# Patient Record
Sex: Female | Born: 1960 | Race: Black or African American | Hispanic: No | Marital: Single | Smoking: Never smoker
Health system: Southern US, Community
[De-identification: ages and names within clinical notes are randomized; demographics above are authoritative.]

## PROBLEM LIST (undated history)

## (undated) DIAGNOSIS — J45909 Unspecified asthma, uncomplicated: Secondary | ICD-10-CM

## (undated) DIAGNOSIS — D219 Benign neoplasm of connective and other soft tissue, unspecified: Secondary | ICD-10-CM

## (undated) DIAGNOSIS — R739 Hyperglycemia, unspecified: Secondary | ICD-10-CM

## (undated) DIAGNOSIS — I10 Essential (primary) hypertension: Secondary | ICD-10-CM

## (undated) HISTORY — DX: Benign neoplasm of connective and other soft tissue, unspecified: D21.9

## (undated) HISTORY — DX: Hyperglycemia, unspecified: R73.9

## (undated) HISTORY — DX: Unspecified asthma, uncomplicated: J45.909

## (undated) HISTORY — DX: Essential (primary) hypertension: I10

---

## 1999-11-28 HISTORY — PX: MYOMECTOMY: SHX85

## 2009-03-18 ENCOUNTER — Ambulatory Visit: Payer: Self-pay | Admitting: Gynecology

## 2009-03-18 ENCOUNTER — Other Ambulatory Visit: Admission: RE | Admit: 2009-03-18 | Discharge: 2009-03-18 | Payer: Self-pay | Admitting: Gynecology

## 2009-03-18 ENCOUNTER — Encounter: Payer: Self-pay | Admitting: Gynecology

## 2010-04-01 ENCOUNTER — Ambulatory Visit: Payer: Self-pay | Admitting: Gynecology

## 2010-04-01 ENCOUNTER — Other Ambulatory Visit: Admission: RE | Admit: 2010-04-01 | Discharge: 2010-04-01 | Payer: Self-pay | Admitting: Gynecology

## 2010-04-26 ENCOUNTER — Encounter: Admission: RE | Admit: 2010-04-26 | Discharge: 2010-04-26 | Payer: Self-pay | Admitting: Gynecology

## 2011-04-10 ENCOUNTER — Other Ambulatory Visit: Payer: Self-pay | Admitting: Gynecology

## 2011-04-10 ENCOUNTER — Other Ambulatory Visit (HOSPITAL_COMMUNITY)
Admission: RE | Admit: 2011-04-10 | Discharge: 2011-04-10 | Disposition: A | Discharge status: Home / Self Care | Payer: BC Managed Care – PPO | Source: Ambulatory Visit | Attending: Gynecology | Admitting: Gynecology

## 2011-04-10 ENCOUNTER — Encounter (INDEPENDENT_AMBULATORY_CARE_PROVIDER_SITE_OTHER): Payer: BC Managed Care – PPO | Admitting: Gynecology

## 2011-04-10 DIAGNOSIS — R823 Hemoglobinuria: Secondary | ICD-10-CM

## 2011-04-10 DIAGNOSIS — Z124 Encounter for screening for malignant neoplasm of cervix: Secondary | ICD-10-CM | POA: Insufficient documentation

## 2011-04-10 DIAGNOSIS — Z01419 Encounter for gynecological examination (general) (routine) without abnormal findings: Secondary | ICD-10-CM

## 2011-04-10 DIAGNOSIS — Z13220 Encounter for screening for lipoid disorders: Secondary | ICD-10-CM

## 2011-04-10 DIAGNOSIS — Z833 Family history of diabetes mellitus: Secondary | ICD-10-CM

## 2011-04-26 ENCOUNTER — Ambulatory Visit (INDEPENDENT_AMBULATORY_CARE_PROVIDER_SITE_OTHER): Payer: BC Managed Care – PPO | Admitting: Gynecology

## 2011-04-26 ENCOUNTER — Other Ambulatory Visit: Payer: BC Managed Care – PPO

## 2011-04-26 DIAGNOSIS — D259 Leiomyoma of uterus, unspecified: Secondary | ICD-10-CM

## 2011-04-26 DIAGNOSIS — N852 Hypertrophy of uterus: Secondary | ICD-10-CM

## 2011-04-26 DIAGNOSIS — N920 Excessive and frequent menstruation with regular cycle: Secondary | ICD-10-CM

## 2011-04-26 DIAGNOSIS — D252 Subserosal leiomyoma of uterus: Secondary | ICD-10-CM

## 2011-04-26 DIAGNOSIS — D251 Intramural leiomyoma of uterus: Secondary | ICD-10-CM

## 2011-06-22 ENCOUNTER — Other Ambulatory Visit: Payer: Self-pay | Admitting: Gynecology

## 2011-06-22 DIAGNOSIS — Z1231 Encounter for screening mammogram for malignant neoplasm of breast: Secondary | ICD-10-CM

## 2011-07-18 ENCOUNTER — Ambulatory Visit
Admission: RE | Admit: 2011-07-18 | Discharge: 2011-07-18 | Disposition: A | Discharge status: Home / Self Care | Payer: BC Managed Care – PPO | Source: Ambulatory Visit | Attending: Gynecology | Admitting: Gynecology

## 2011-07-18 DIAGNOSIS — Z1231 Encounter for screening mammogram for malignant neoplasm of breast: Secondary | ICD-10-CM

## 2012-01-17 ENCOUNTER — Other Ambulatory Visit: Payer: Self-pay | Admitting: Physician Assistant

## 2012-04-18 ENCOUNTER — Other Ambulatory Visit: Payer: Self-pay | Admitting: *Deleted

## 2012-04-18 MED ORDER — NORETHIN ACE-ETH ESTRAD-FE 1-20 MG-MCG(24) PO TABS: 1.0000 | ORAL_TABLET | Freq: Every day | ORAL | Status: DC

## 2012-04-18 NOTE — Telephone Encounter (Signed)
Pt calling requesting refill on loestrin fe, annual appointment on may 30th. rx sent. No refills

## 2012-04-19 ENCOUNTER — Other Ambulatory Visit: Payer: Self-pay

## 2012-04-19 ENCOUNTER — Other Ambulatory Visit: Payer: Self-pay | Admitting: Emergency Medicine

## 2012-04-19 MED ORDER — FLUTICASONE PROPIONATE 50 MCG/ACT NA SUSP: 2.0000 | Freq: Every day | NASAL | Status: DC

## 2012-04-23 ENCOUNTER — Encounter: Payer: BC Managed Care – PPO | Admitting: Gynecology

## 2012-04-25 ENCOUNTER — Ambulatory Visit (INDEPENDENT_AMBULATORY_CARE_PROVIDER_SITE_OTHER): Payer: BC Managed Care – PPO | Admitting: Gynecology

## 2012-04-25 ENCOUNTER — Encounter: Payer: Self-pay | Admitting: Gynecology

## 2012-04-25 VITALS — BP 114/70 | Ht 63.5 in | Wt 124.0 lb

## 2012-04-25 DIAGNOSIS — Z01419 Encounter for gynecological examination (general) (routine) without abnormal findings: Secondary | ICD-10-CM

## 2012-04-25 DIAGNOSIS — D259 Leiomyoma of uterus, unspecified: Secondary | ICD-10-CM

## 2012-04-25 DIAGNOSIS — Z3009 Encounter for other general counseling and advice on contraception: Secondary | ICD-10-CM

## 2012-04-25 MED ORDER — NORETHIN ACE-ETH ESTRAD-FE 1-20 MG-MCG(24) PO TABS: 1.0000 | ORAL_TABLET | Freq: Every day | ORAL | Status: DC

## 2012-04-25 NOTE — Progress Notes (Signed)
Tiffany Cline 1961-05-28 [id number]        51 y.o.  for annual exam.  Several issues noted below.  Past medical history,surgical history, medications, allergies, family history and social history were all reviewed and documented in the EPIC chart. ROS:  Was performed and pertinent positives and negatives are included in the history.  Exam: Tiffany Cline present Filed Vitals:   04/25/12 1108  BP: 114/70   General appearance  Normal Skin grossly normal Head/Neck normal with no cervical or supraclavicular adenopathy thyroid normal Lungs  clear Cardiac RR, without RMG Abdominal  soft, nontender, without masses, organomegaly or hernia Breasts  examined lying and sitting without masses, retractions, discharge or axillary adenopathy.  Left nipple inverted which easily reverts. Pelvic  Ext/BUS/vagina  normal   Cervix  normal   Uterus  Retroverted to axial, bulky in size and shape, midline and mobile nontender   Adnexa  Without masses or tenderness    Anus and perineum  normal   Rectovaginal  normal sphincter tone without palpated masses or tenderness.    Assessment/Plan:  50 y.o. female for annual exam.    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 Patient is asymptomatic and we'll continue to monitor at her choice. 2. Contraceptive management. Patient is on low dose oral contraceptives. She is being treated for hypertension, well controlled. I again reviewed the risks of oral contraceptives with advancing age and underlying treated hypertension. The risks of stroke, heart attack, DVT reviewed. The issues of pregnancy and its risks as well as her history of migraines which have resolved on oral contraceptives were all reviewed. Patient wants to continue and she is comfortable and accepts the risks. I refilled her times a year. I did discuss that age 68-51 we may start checking a FSH during the pill free week. 3. Mammography. Patient is due for mammography in July I  reminded her to schedule this. Her left nipple is inverted which it has been and easily reverts on exam. SBE monthly reviewed. 4. Pap smear. Patient has no history of abnormal Pap smears before with several normal reports in her chart and last Pap smear May 2012. I reviewed current screening guidelines and a Pap smear was done today we'll plan on a less frequent screening interval. 5. Health maintenance. Patient is due to follow up with her internist in reference to her hypertension and she will continue to follow with them. She had a normal glucose, lipid profile and CBC last year and no blood work was ordered this year.  A urinalysis was done. Assuming she continues well from a gynecologic standpoint and she will see me in a year, sooner as needed.    [name] MD, 11:49 AM 04/25/2012

## 2012-04-25 NOTE — Patient Instructions (Addendum)
Continue oral contraceptives as we discussed. Schedule mammogram in July. Follow up in one year for annual gynecologic exam.

## 2012-04-26 LAB — URINALYSIS W MICROSCOPIC + REFLEX CULTURE
Bacteria, UA: NONE SEEN
Bilirubin Urine: NEGATIVE
Casts: NONE SEEN
Crystals: NONE SEEN
Glucose, UA: NEGATIVE mg/dL
Hgb urine dipstick: NEGATIVE
Ketones, ur: NEGATIVE mg/dL
Leukocytes, UA: NEGATIVE
Nitrite: NEGATIVE
Protein, ur: NEGATIVE mg/dL
Specific Gravity, Urine: 1.017 (ref 1.005–1.030)
Squamous Epithelial / LPF: NONE SEEN
Urobilinogen, UA: 0.2 mg/dL (ref 0.0–1.0)
pH: 5.5 (ref 5.0–8.0)

## 2012-05-02 ENCOUNTER — Encounter: Payer: Self-pay | Admitting: Gynecology

## 2012-05-12 ENCOUNTER — Ambulatory Visit (INDEPENDENT_AMBULATORY_CARE_PROVIDER_SITE_OTHER): Payer: BC Managed Care – PPO | Admitting: Emergency Medicine

## 2012-05-12 VITALS — BP 131/82 | HR 67 | Temp 98.6°F | Resp 16 | Ht 63.0 in | Wt 124.0 lb

## 2012-05-12 DIAGNOSIS — J309 Allergic rhinitis, unspecified: Secondary | ICD-10-CM

## 2012-05-12 DIAGNOSIS — J45909 Unspecified asthma, uncomplicated: Secondary | ICD-10-CM

## 2012-05-12 DIAGNOSIS — I10 Essential (primary) hypertension: Secondary | ICD-10-CM

## 2012-05-12 DIAGNOSIS — J302 Other seasonal allergic rhinitis: Secondary | ICD-10-CM

## 2012-05-12 MED ORDER — MONTELUKAST SODIUM 10 MG PO TABS: 10.0000 mg | ORAL_TABLET | Freq: Every day | ORAL | Status: DC

## 2012-05-12 MED ORDER — ALBUTEROL SULFATE HFA 108 (90 BASE) MCG/ACT IN AERS: 2.0000 | INHALATION_SPRAY | RESPIRATORY_TRACT | Status: DC | PRN

## 2012-05-12 MED ORDER — AMLODIPINE BESYLATE 5 MG PO TABS: 5.0000 mg | ORAL_TABLET | Freq: Every day | ORAL | Status: DC

## 2012-05-12 MED ORDER — FLUTICASONE PROPIONATE 50 MCG/ACT NA SUSP: 2.0000 | Freq: Every day | NASAL | Status: DC

## 2012-05-12 MED ORDER — FLUTICASONE-SALMETEROL 250-50 MCG/DOSE IN AEPB: 1.0000 | INHALATION_SPRAY | Freq: Two times a day (BID) | RESPIRATORY_TRACT | Status: DC

## 2012-05-12 NOTE — Progress Notes (Signed)
  Subjective:    Patient ID: Tiffany Cline, female    DOB: 1960/12/26, 51 y.o.   MRN: [id number]  HPI patient needs refill of all her allergy medicines. She has been doing relatively well except for recent headache. She recently finished a long trip to [place] and is now back at work.    Review of Systems     Objective:   Physical Exam HEENT exam is unremarkable neck supple chest clear.        Assessment & Plan:  Asthma stable at present time no changes in medication

## 2012-05-14 ENCOUNTER — Ambulatory Visit: Payer: Self-pay | Admitting: Emergency Medicine

## 2012-06-27 ENCOUNTER — Other Ambulatory Visit: Payer: Self-pay | Admitting: Emergency Medicine

## 2012-09-11 ENCOUNTER — Other Ambulatory Visit: Payer: Self-pay | Admitting: Gynecology

## 2012-09-11 DIAGNOSIS — Z1231 Encounter for screening mammogram for malignant neoplasm of breast: Secondary | ICD-10-CM

## 2012-09-12 ENCOUNTER — Ambulatory Visit (INDEPENDENT_AMBULATORY_CARE_PROVIDER_SITE_OTHER): Payer: BC Managed Care – PPO | Admitting: Radiology

## 2012-09-12 DIAGNOSIS — Z23 Encounter for immunization: Secondary | ICD-10-CM

## 2012-09-26 ENCOUNTER — Ambulatory Visit
Admission: RE | Admit: 2012-09-26 | Discharge: 2012-09-26 | Disposition: A | Discharge status: Home / Self Care | Payer: BC Managed Care – PPO | Source: Ambulatory Visit | Attending: Gynecology | Admitting: Gynecology

## 2012-09-26 DIAGNOSIS — Z1231 Encounter for screening mammogram for malignant neoplasm of breast: Secondary | ICD-10-CM

## 2012-12-03 ENCOUNTER — Ambulatory Visit: Payer: BC Managed Care – PPO | Admitting: Physician Assistant

## 2012-12-03 VITALS — BP 114/66 | HR 97 | Temp 98.8°F | Resp 18 | Ht 63.5 in | Wt 120.0 lb

## 2012-12-03 DIAGNOSIS — R509 Fever, unspecified: Secondary | ICD-10-CM

## 2012-12-03 DIAGNOSIS — R059 Cough, unspecified: Secondary | ICD-10-CM

## 2012-12-03 DIAGNOSIS — J101 Influenza due to other identified influenza virus with other respiratory manifestations: Secondary | ICD-10-CM

## 2012-12-03 DIAGNOSIS — J111 Influenza due to unidentified influenza virus with other respiratory manifestations: Secondary | ICD-10-CM

## 2012-12-03 DIAGNOSIS — R05 Cough: Secondary | ICD-10-CM

## 2012-12-03 LAB — POCT INFLUENZA A/B
Influenza A, POC: POSITIVE
Influenza B, POC: NEGATIVE

## 2012-12-03 MED ORDER — OSELTAMIVIR PHOSPHATE 75 MG PO CAPS: 75.0000 mg | ORAL_CAPSULE | Freq: Two times a day (BID) | ORAL | Status: DC

## 2012-12-03 MED ORDER — HYDROCOD POLST-CHLORPHEN POLST 10-8 MG/5ML PO LQCR: 5.0000 mL | Freq: Two times a day (BID) | ORAL | Status: DC | PRN

## 2012-12-03 NOTE — Progress Notes (Signed)
Patient ID: Tiffany Cline MRN: [id number], DOB: Aug 15, 1961, 52 y.o. Date of Encounter: 12/03/2012, 10:10 AM  Primary Physician: Lucilla Edin, MD  Chief Complaint: fever, chills, nasal congestion  HPI: 52 y.o. year old female with presents with 1 1/2 day history of nasal congestion, chills, and slight dry cough. Complains of chest pain while coughing. No SOB, nausea, vomiting, or dizziness. Did have a headache for the past 3 days that has improved slightly. Complains of slight scratchy sore throat.  No otalgia or sinus pain.  Has thin, watery rhinorrhea. Has not taken any OTC medications for this.   Patient is otherwise doing well without issues or complaints.  Past Medical History  Diagnosis Date  . Hypertension   . Leiomyoma   . Allergy      Home Meds: Prior to Admission medications   Medication Sig Start Date End Date Taking? Authorizing Provider  albuterol (PROVENTIL HFA;VENTOLIN HFA) 108 (90 BASE) MCG/ACT inhaler Inhale 2 puffs into the lungs every 4 (four) hours as needed for wheezing (cough, shortness of breath or wheezing.). 05/12/12 05/12/13 Yes Collene Gobble, MD  amLODipine (NORVASC) 5 MG tablet Take 1 tablet (5 mg total) by mouth daily. 05/12/12  Yes Collene Gobble, MD  ASTEPRO 0.15 % SOLN USE ONE TO TWO SPRAYS IN EACH NOSTRIL TWICE DAILY 06/27/12  Yes Marzella Schlein McClung, PA-C  Azelastine HCl (ASTEPRO NA) by Nasal route.     Yes Historical Provider, MD  fluticasone (FLONASE) 50 MCG/ACT nasal spray Place 2 sprays into the nose daily. 05/12/12  Yes Collene Gobble, MD  Fluticasone-Salmeterol (ADVAIR DISKUS) 250-50 MCG/DOSE AEPB Inhale 1 puff into the lungs 2 (two) times daily. 05/12/12  Yes Collene Gobble, MD  GuaiFENesin (MUCINEX PO) Take by mouth.     Yes Historical Provider, MD  Loratadine (CLARITIN PO) Take by mouth.     Yes Historical Provider, MD  montelukast (SINGULAIR) 10 MG tablet Take 1 tablet (10 mg total) by mouth at bedtime. 05/12/12  Yes Collene Gobble, MD  Montelukast Sodium  (SINGULAIR PO) Take by mouth.     Yes Historical Provider, MD  multivitamin [facility]) per tablet Take 1 tablet by mouth daily.     Yes Historical Provider, MD  Norethindrone Acetate-Ethinyl Estrad-FE (LOESTRIN 24 FE) 1-20 MG-MCG(24) tablet Take 1 tablet by mouth daily. 04/25/12  Yes [name], MD  chlorpheniramine-HYDROcodone (TUSSIONEX PENNKINETIC ER) 10-8 MG/5ML LQCR Take 5 mLs by mouth every 12 (twelve) hours as needed (cough). 12/03/12   Jaquavious Mercer Jaquita Rector, PA-C  oseltamivir (TAMIFLU) 75 MG capsule Take 1 capsule (75 mg total) by mouth 2 (two) times daily. 12/03/12   Nelva Nay, PA-C    Allergies:  Allergies  Allergen Reactions  . Septra (Sulfamethoxazole W-Trimethoprim) Shortness Of Breath and Rash  . Sulfa Antibiotics     History   Social History  . Marital Status: Single    Spouse Name: [name]    Number of Children: [name]  . Years of Education: [name]   Occupational History  . Not on file.   Social History Main Topics  . Smoking status: Never Smoker   . Smokeless tobacco: Never Used  . Alcohol Use: No  . Drug Use: No  . Sexually Active: Yes    Birth Control/ Protection: Pill   Other Topics Concern  . Not on file   Social History Narrative  . No narrative on file     Review of Systems: Constitutional: positive for chills and fever.  Negative for night sweats, weight changes, or fatigue  HEENT: negative for vision changes, hearing loss. Positive for congestion, rhinorrhea, ST. No epistaxis, or sinus pressure Cardiovascular: negative for chest pain or palpitations Respiratory: positive for cough. negative for hemoptysis, wheezing, shortness of breath. Abdominal: negative for abdominal pain, nausea, vomiting, diarrhea, or constipation Dermatological: negative for rashes. Neurologic: negative for headache, dizziness, or syncope   Physical Exam: Blood pressure 114/66, pulse 97, temperature 98.8 F (37.1 C), temperature source Oral, resp. rate 18, height 5' 3.5"  (1.613 m), weight 120 lb (54.432 kg), last menstrual period 11/29/2012, SpO2 98.00%., Body mass index is 20.92 kg/(m^2). General: Well developed, well nourished, in no acute distress. Head: Normocephalic, atraumatic, eyes without discharge, sclera non-icteric, nares are without discharge. Bilateral auditory canals clear, TM's are without perforation, pearly grey and translucent with reflective cone of light bilaterally. Oral cavity moist, posterior pharynx without exudate, erythema, peritonsillar abscess, or post nasal drip.  Neck: Supple. No thyromegaly. Full ROM. No lymphadenopathy. Lungs: Clear bilaterally to auscultation without wheezes, rales, or rhonchi. Breathing is unlabored. Heart: RRR with S1 S2. No murmurs, rubs, or gallops appreciated. Msk:  Strength and tone normal for age. Extremities/Skin: Warm and dry. No clubbing or cyanosis. No edema.  Neuro: Alert and oriented X 3. Moves all extremities spontaneously. Gait is normal. CNII-XII grossly in tact. Psych:  Responds to questions appropriately with a normal affect.   Labs: Results for orders placed in visit on 12/03/12  POCT INFLUENZA A/B      Component Value Range   Influenza A, POC Positive     Influenza B, POC Negative       ASSESSMENT AND PLAN:  52 y.o. year old female with influenza Increase fluids and rest Ibuprofen and tylenol in alternating doses q3hours Start Tamiflu 75 mg bid x 5 days Tussionex q12hours prn cough Follow up if symptoms worsen or fail to improve.  SignedRhoderick Moody, PA-C 12/03/2012 10:10 AM

## 2012-12-10 ENCOUNTER — Telehealth: Payer: Self-pay | Admitting: *Deleted

## 2012-12-10 MED ORDER — MOMETASONE FURO-FORMOTEROL FUM 200-5 MCG/ACT IN AERO: 2.0000 | INHALATION_SPRAY | Freq: Two times a day (BID) | RESPIRATORY_TRACT | Status: DC

## 2012-12-10 NOTE — Telephone Encounter (Signed)
[facility] fax that pt ins is not covering Advair for 2014.  They prefer Dulera or Symbicort. Please change rx to one of these if appropriate.

## 2013-03-30 ENCOUNTER — Other Ambulatory Visit: Payer: Self-pay | Admitting: Gynecology

## 2013-04-05 ENCOUNTER — Other Ambulatory Visit: Payer: Self-pay | Admitting: Physician Assistant

## 2013-04-29 ENCOUNTER — Ambulatory Visit (INDEPENDENT_AMBULATORY_CARE_PROVIDER_SITE_OTHER): Payer: BC Managed Care – PPO | Admitting: Gynecology

## 2013-04-29 ENCOUNTER — Encounter: Payer: Self-pay | Admitting: Gynecology

## 2013-04-29 VITALS — BP 124/80 | Ht 63.25 in | Wt 123.0 lb

## 2013-04-29 DIAGNOSIS — Z01419 Encounter for gynecological examination (general) (routine) without abnormal findings: Secondary | ICD-10-CM

## 2013-04-29 DIAGNOSIS — Z309 Encounter for contraceptive management, unspecified: Secondary | ICD-10-CM

## 2013-04-29 DIAGNOSIS — D259 Leiomyoma of uterus, unspecified: Secondary | ICD-10-CM

## 2013-04-29 MED ORDER — NORETHIN ACE-ETH ESTRAD-FE 1-20 MG-MCG(24) PO CHEW: 1.0000 | CHEWABLE_TABLET | Freq: Every day | ORAL | Status: DC

## 2013-04-29 NOTE — Patient Instructions (Signed)
Followup in one year for annual exam, sooner if any issues 

## 2013-04-29 NOTE — Progress Notes (Signed)
Tiffany Cline 22-Nov-1961 [id number]        52 y.o.  G0P0 for annual exam.  Several issues noted below.  Past medical history,surgical history, medications, allergies, family history and social history were all reviewed and documented in the EPIC chart.  ROS:  Performed and pertinent positives and negatives are included in the history, assessment and plan .  Exam: Tiffany Cline assistant Filed Vitals:   04/29/13 1210  BP: 124/80  Height: 5' 3.25" (1.607 m)  Weight: 123 lb (55.792 kg)   General appearance  Normal Skin grossly normal Head/Neck normal with no cervical or supraclavicular adenopathy thyroid normal Lungs  clear Cardiac RR, without RMG Abdominal  soft, nontender, without masses, organomegaly or hernia Breasts  examined lying and sitting without masses, retractions, discharge or axillary adenopathy. Left nipple dimpled as always and easily reverts. Pelvic  Ext/BUS/vagina  normal   Cervix  normal   Uterus  Bulky, midline and mobile nontender   Adnexa  Without masses or tenderness    Anus and perineum  normal   Rectovaginal  normal sphincter tone without palpated masses or tenderness.    Assessment/Plan:  52 y.o. G0P0 female for annual exam.   1. Contraceptive management. Patient continues on oral contraceptives doing well. I again reviewed the issues and risks to include possible increased risk of stroke heart attack DVT with advancing age as well as hypertension. She is well controlled understands the risks and wants to continue and I refilled her times a year. Alternatives to include IUD again discussed with her. Not having menopausal symptoms during the pill free week. We'll start to check [facility] next year. 2. Leiomyoma. Uterus bulky on exam and stable from prior exams. Known to have multiple small myomas on ultrasound. We'll continue with annual followup exams. Not having any heavy or atypical bleeding. 3. Mammography 08/2012. Continue with annual mammography. Left nipple dimpled as  always, easily reverts. SBE monthly reviewed. 4. Pap smear 2012. No Pap smear done today. No history of significant abnormal Pap smears. Plan repeat next year at 3 year interval. 5. Colonoscopy. Recommended screening colonoscopy as she turned 52. Patient agrees to arrange. 6. Health maintenance. Dr. Cleta Cline does all of her blood work and she has an appointment to see him over the next several weeks. Followup one year, sooner as needed.    Tiffany Lords MD, 12:37 PM 04/29/2013

## 2013-05-24 ENCOUNTER — Other Ambulatory Visit: Payer: Self-pay | Admitting: Emergency Medicine

## 2013-05-27 ENCOUNTER — Ambulatory Visit (INDEPENDENT_AMBULATORY_CARE_PROVIDER_SITE_OTHER): Payer: BC Managed Care – PPO | Admitting: Emergency Medicine

## 2013-05-27 ENCOUNTER — Encounter: Payer: Self-pay | Admitting: Emergency Medicine

## 2013-05-27 VITALS — BP 112/84 | HR 71 | Temp 99.9°F | Resp 16 | Ht 63.75 in | Wt 124.0 lb

## 2013-05-27 DIAGNOSIS — I10 Essential (primary) hypertension: Secondary | ICD-10-CM | POA: Insufficient documentation

## 2013-05-27 DIAGNOSIS — J309 Allergic rhinitis, unspecified: Secondary | ICD-10-CM

## 2013-05-27 DIAGNOSIS — Z23 Encounter for immunization: Secondary | ICD-10-CM

## 2013-05-27 LAB — CBC WITH DIFFERENTIAL/PLATELET
Basophils Absolute: 0 10*3/uL (ref 0.0–0.1)
Basophils Relative: 0 % (ref 0–1)
Eosinophils Absolute: 0.1 10*3/uL (ref 0.0–0.7)
Eosinophils Relative: 1 % (ref 0–5)
HCT: 39.5 % (ref 36.0–46.0)
Hemoglobin: 13.1 g/dL (ref 12.0–15.0)
Lymphocytes Relative: 34 % (ref 12–46)
Lymphs Abs: 1.7 10*3/uL (ref 0.7–4.0)
MCH: 28.1 pg (ref 26.0–34.0)
MCHC: 33.2 g/dL (ref 30.0–36.0)
MCV: 84.6 fL (ref 78.0–100.0)
Monocytes Absolute: 0.6 10*3/uL (ref 0.1–1.0)
Monocytes Relative: 13 % — ABNORMAL HIGH (ref 3–12)
Neutro Abs: 2.5 10*3/uL (ref 1.7–7.7)
Neutrophils Relative %: 52 % (ref 43–77)
Platelets: 250 10*3/uL (ref 150–400)
RBC: 4.67 MIL/uL (ref 3.87–5.11)
RDW: 14.4 % (ref 11.5–15.5)
WBC: 4.9 10*3/uL (ref 4.0–10.5)

## 2013-05-27 MED ORDER — AMLODIPINE BESYLATE 5 MG PO TABS: 5.0000 mg | ORAL_TABLET | Freq: Every day | ORAL | Status: DC

## 2013-05-27 NOTE — Progress Notes (Signed)
  Subjective:    Patient ID: Tiffany Cline, female    DOB: [birth year], 52 y.o.   MRN: [id number]  HPI patient in for followup of her high blood pressure. She is currently on birth control pills she is on Norvasc daily for blood pressure control. She has a history of allergies and has used to air and Singulair and Flonase in the past but during the summer she rarely has problems. She has no complaints of chest pain shortness of breath GI regularities or other problems. 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 Neck supple chest clear heart regular rate no murmurs abdomen soft nontender extremities without edema.       Assessment & Plan:  Routine labs were done including cholesterol. She was given her first hep B vaccine to complete the series. Her Norvasc was refilled for one year she was also given a prescription for shingles vaccine. She agrees to call me when she is ready to get her shingles vaccine.

## 2013-05-28 LAB — COMPREHENSIVE METABOLIC PANEL
ALT: 17 U/L (ref 0–35)
AST: 16 U/L (ref 0–37)
Albumin: 3.6 g/dL (ref 3.5–5.2)
Alkaline Phosphatase: 39 U/L (ref 39–117)
BUN: 11 mg/dL (ref 6–23)
CO2: 26 mEq/L (ref 19–32)
Calcium: 8.8 mg/dL (ref 8.4–10.5)
Chloride: 106 mEq/L (ref 96–112)
Creat: 0.71 mg/dL (ref 0.50–1.10)
Glucose, Bld: 66 mg/dL — ABNORMAL LOW (ref 70–99)
Potassium: 4.1 mEq/L (ref 3.5–5.3)
Sodium: 139 mEq/L (ref 135–145)
Total Bilirubin: 0.3 mg/dL (ref 0.3–1.2)
Total Protein: 6.7 g/dL (ref 6.0–8.3)

## 2013-05-28 LAB — LIPID PANEL
Cholesterol: 175 mg/dL (ref 0–200)
HDL: 69 mg/dL (ref >39)
LDL Cholesterol: 97 mg/dL (ref 0–99)
Total CHOL/HDL Ratio: 2.5 Ratio
Triglycerides: 43 mg/dL (ref <150)
VLDL: 9 mg/dL (ref 0–40)

## 2013-05-29 ENCOUNTER — Other Ambulatory Visit: Payer: Self-pay | Admitting: Physician Assistant

## 2013-06-25 ENCOUNTER — Other Ambulatory Visit: Payer: Self-pay | Admitting: Emergency Medicine

## 2013-06-27 ENCOUNTER — Ambulatory Visit (INDEPENDENT_AMBULATORY_CARE_PROVIDER_SITE_OTHER): Payer: BC Managed Care – PPO

## 2013-06-27 DIAGNOSIS — Z23 Encounter for immunization: Secondary | ICD-10-CM

## 2013-06-27 NOTE — Progress Notes (Unsigned)
  Subjective:    Patient ID: Tiffany Cline, female    DOB: 1961/07/07, 52 y.o.   MRN: [id number]  HPI  Patient here for second Hep B injection only.  Review of Systems     Objective:   Physical Exam        Assessment & Plan:

## 2013-08-26 ENCOUNTER — Ambulatory Visit (INDEPENDENT_AMBULATORY_CARE_PROVIDER_SITE_OTHER): Payer: BC Managed Care – PPO | Admitting: *Deleted

## 2013-08-26 DIAGNOSIS — Z23 Encounter for immunization: Secondary | ICD-10-CM

## 2013-08-28 ENCOUNTER — Other Ambulatory Visit: Payer: Self-pay | Admitting: Emergency Medicine

## 2013-09-03 ENCOUNTER — Other Ambulatory Visit: Payer: Self-pay

## 2013-09-03 DIAGNOSIS — Z1231 Encounter for screening mammogram for malignant neoplasm of breast: Secondary | ICD-10-CM

## 2013-09-29 ENCOUNTER — Ambulatory Visit
Admission: RE | Admit: 2013-09-29 | Discharge: 2013-09-29 | Disposition: A | Discharge status: Home / Self Care | Payer: BC Managed Care – PPO | Source: Ambulatory Visit

## 2013-09-29 DIAGNOSIS — Z1231 Encounter for screening mammogram for malignant neoplasm of breast: Secondary | ICD-10-CM

## 2013-11-28 ENCOUNTER — Other Ambulatory Visit: Payer: Self-pay | Admitting: Emergency Medicine

## 2013-12-23 ENCOUNTER — Ambulatory Visit: Payer: BC Managed Care – PPO | Admitting: Physician Assistant

## 2013-12-23 ENCOUNTER — Ambulatory Visit: Payer: BC Managed Care – PPO

## 2013-12-23 ENCOUNTER — Encounter: Payer: Self-pay | Admitting: Physician Assistant

## 2013-12-23 VITALS — BP 110/62 | HR 82 | Temp 99.1°F | Resp 16 | Ht 63.75 in | Wt 121.2 lb

## 2013-12-23 DIAGNOSIS — J069 Acute upper respiratory infection, unspecified: Secondary | ICD-10-CM

## 2013-12-23 DIAGNOSIS — R059 Cough, unspecified: Secondary | ICD-10-CM

## 2013-12-23 DIAGNOSIS — J45901 Unspecified asthma with (acute) exacerbation: Secondary | ICD-10-CM

## 2013-12-23 DIAGNOSIS — R05 Cough: Secondary | ICD-10-CM

## 2013-12-23 LAB — POCT INFLUENZA A/B
Influenza A, POC: NEGATIVE
Influenza B, POC: NEGATIVE

## 2013-12-23 MED ORDER — HYDROCOD POLST-CHLORPHEN POLST 10-8 MG/5ML PO LQCR: 5.0000 mL | Freq: Two times a day (BID) | ORAL | Status: DC | PRN

## 2013-12-23 MED ORDER — PREDNISONE 20 MG PO TABS: ORAL_TABLET | ORAL | Status: DC

## 2013-12-23 MED ORDER — AZITHROMYCIN 250 MG PO TABS: ORAL_TABLET | ORAL | Status: DC

## 2013-12-23 MED ORDER — ALBUTEROL SULFATE (2.5 MG/3ML) 0.083% IN NEBU: 2.5000 mg | INHALATION_SOLUTION | Freq: Once | RESPIRATORY_TRACT | Status: DC

## 2013-12-23 MED ORDER — IPRATROPIUM BROMIDE 0.02 % IN SOLN: 0.5000 mg | Freq: Once | RESPIRATORY_TRACT | Status: AC

## 2013-12-23 NOTE — Patient Instructions (Signed)
Asthma, Adult Asthma is a recurring condition in which the airways tighten and narrow. Asthma can make it difficult to breathe. It can cause coughing, wheezing, and shortness of breath. Asthma episodes (also called asthma attacks) range from minor to life-threatening. Asthma cannot be cured, but medicines and lifestyle changes can help control it. CAUSES Asthma is believed to be caused by inherited (genetic) and environmental factors, but its exact cause is unknown. Asthma may be triggered by allergens, lung infections, or irritants in the air. Asthma triggers are different for each person. Common triggers include:   Animal dander.  Dust mites.  Cockroaches.  Pollen from trees or grass.  Mold.  Smoke.  Air pollutants such as dust, household cleaners, hair sprays, aerosol sprays, paint fumes, strong chemicals, or strong odors.  Cold air, weather changes, and winds (which increase molds and pollens in the air).  Strong emotional expressions such as crying or laughing hard.  Stress.  Certain medicines (such as aspirin) or types of drugs (such as beta-blockers).  Sulfites in foods and drinks. Foods and drinks that may contain sulfites include dried fruit, potato chips, and sparkling grape juice.  Infections or inflammatory conditions such as the flu, a cold, or an inflammation of the nasal membranes (rhinitis).  Gastroesophageal reflux disease (GERD).  Exercise or strenuous activity. SYMPTOMS Symptoms may occur immediately after asthma is triggered or many hours later. Symptoms include:  Wheezing.  Excessive nighttime or early morning coughing.  Frequent or severe coughing with a common cold.  Chest tightness.  Shortness of breath. DIAGNOSIS  The diagnosis of asthma is made by a review of your medical history and a physical exam. Tests may also be performed. These may include:  Lung function studies. These tests show how much air you breath in and out.  Allergy  tests.  Imaging tests such as X-rays. TREATMENT  Asthma cannot be cured, but it can usually be controlled. Treatment involves identifying and avoiding your asthma triggers. It also involves medicines. There are 2 classes of medicine used for asthma treatment:   Controller medicines. These prevent asthma symptoms from occurring. They are usually taken every day.  Reliever or rescue medicines. These quickly relieve asthma symptoms. They are used as needed and provide short-term relief. Your health care provider will help you create an asthma action plan. An asthma action plan is a written plan for managing and treating your asthma attacks. It includes a list of your asthma triggers and how they may be avoided. It also includes information on when medicines should be taken and when their dosage should be changed. An action plan may also involve the use of a device called a peak flow meter. A peak flow meter measures how well the lungs are working. It helps you monitor your condition. HOME CARE INSTRUCTIONS   Take medicine as directed by your health care provider. Speak with your health care provider if you have questions about how or when to take the medicines.  Use a peak flow meter as directed by your health care provider. Record and keep track of readings.  Understand and use the action plan to help minimize or stop an asthma attack without needing to seek medical care.  Control your home environment in the following ways to help prevent asthma attacks:  Do not smoke. Avoid being exposed to secondhand smoke.  Change your heating and air conditioning filter regularly.  Limit your use of fireplaces and wood stoves.  Get rid of pests (such as roaches and   mice) and their droppings.  Throw away plants if you see mold on them.  Clean your floors and dust regularly. Use unscented cleaning products.  Try to have someone else vacuum for you regularly. Stay out of rooms while they are being  vacuumed and for a short while afterward. If you vacuum, use a dust mask from a hardware store, a double-layered or microfilter vacuum cleaner bag, or a vacuum cleaner with a HEPA filter.  Replace carpet with wood, tile, or vinyl flooring. Carpet can trap dander and dust.  Use allergy-proof pillows, mattress covers, and box spring covers.  Wash bed sheets and blankets every week in hot water and dry them in a dryer.  Use blankets that are made of polyester or cotton.  Clean bathrooms and kitchens with bleach. If possible, have someone repaint the walls in these rooms with mold-resistant paint. Keep out of the rooms that are being cleaned and painted.  Wash hands frequently. SEEK MEDICAL CARE IF:   You have wheezing, shortness of breath, or a cough even if taking medicine to prevent attacks.  The colored mucus you cough up (sputum) is thicker than usual.  Your sputum changes from clear or white to yellow, green, gray, or bloody.  You have any problems that may be related to the medicines you are taking (such as a rash, itching, swelling, or trouble breathing).  You are using a reliever medicine more than 2 3 times per week.  Your peak flow is still at 50 79% of you personal best after following your action plan for 1 hour. SEEK IMMEDIATE MEDICAL CARE IF:   You seem to be getting worse and are unresponsive to treatment during an asthma attack.  You are short of breath even at rest.  You get short of breath when doing very little physical activity.  You have difficulty eating, drinking, or talking due to asthma symptoms.  You develop chest pain.  You develop a fast heartbeat.  You have a bluish color to your lips or fingernails.  You are lightheaded, dizzy, or faint.  Your peak flow is less than 50% of your personal best.  You have a fever or persistent symptoms for more than 2 3 days.  You have a fever and symptoms suddenly get worse. MAKE SURE YOU:   Understand these  instructions.  Will watch your condition.  Will get help right away if you are not doing well or get worse. Document Released: 11/13/2005 Document Revised: 07/16/2013 Document Reviewed: 06/12/2013 [employer] Patient Information 2014 [employer].  

## 2013-12-23 NOTE — Progress Notes (Signed)
Subjective:    Patient ID: Tiffany Cline, female    DOB: Aug 14, 1961, 53 y.o.   MRN: [id number]  HPI 53 year old female presents with a 3 day history of nasal congestion, chest congestion, post nasal drip, and cough. Mild sinus pressure. Subjective fever and chills. Nasal congestion thick and green/yellow. Cough is productive of green/yellow sputum and not associated with time of day. Ears feel full, leading to sensation of muffled hearing. Has been taking her Dulera, albuterol, and Mucinex 1,200 mg without any change in her chest congestion. No GI complaints. Appetite normal. Multiple sick contacts. 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 Influenza vaccine this year. States she has chest congestion at baseline but is able to clear this usually with 600 mg of Mucinex. She has not been able to do this with this URI.   No recent antibiotics, or recent travels.   No leg trauma, sedentary periods, h/o cancer, or tobacco use.  Her asthma is well controlled at baseline. Typically no flares unless she develops an upper respiratory infection. Last asthma exacerbation was January 2014 when she had influenza.   PMH: Past Medical History  Diagnosis Date  . Hypertension   . Leiomyoma   . Allergy   . Asthma     induced by cold and allergies    Home Meds: Prior to Admission medications   Medication Sig Start Date End Date Taking? Authorizing Provider  amLODipine (NORVASC) 5 MG tablet Take 1 tablet (5 mg total) by mouth daily. 05/27/13  Yes [name], MD  ASTEPRO 0.15 % SOLN USE ONE TO TWO SPRAYS IN EACH NOSTRIL TWICE DAILY 06/27/12  Yes Argentina Donovan, PA-C  DULERA 200-5 MCG/ACT AERO INHALE TWO PUFFS INTO THE LUNGS ONCE DAILY 05/29/13  Yes [name], MD  fluticasone (FLONASE) 50 MCG/ACT nasal spray USE TWO SPRAYS IN EACH NOSTRIL DAILY 11/28/13  Yes [name], MD  GuaiFENesin (MUCINEX PO) Take by mouth.     Yes Historical Provider, MD  montelukast (SINGULAIR) 10 MG tablet TAKE ONE TABLET BY  MOUTH EVERY DAY AT BEDTIME 06/25/13  Yes [name], MD  Norethin Ace-Eth Estrad-FE (MINASTRIN 24 FE) 1-20 MG-MCG(24) CHEW Chew 1 tablet by mouth daily. 04/29/13  Yes Anastasio Auerbach, MD  PROAIR HFA 108 (90 BASE) MCG/ACT inhaler INHALE TWO PUFFS BY MOUTH EVERY 4 HOURS AS NEEDED FOR WHEEZING, COUGH OR SHORTNESS OF BREATH 08/28/13  Yes [name], MD  multivitamin [facility]) per tablet Take 1 tablet by mouth daily.      Historical Provider, MD    Allergies:  Allergies  Allergen Reactions  . Septra [Sulfamethoxazole-Trimethoprim] Shortness Of Breath and Rash  . Sulfa Antibiotics     History   Social History  . Marital Status: Single    Spouse Name: [name]    Number of Children: [name]  . Years of Education: [name]   Occupational History  . Not on file.   Social History Main Topics  . Smoking status: Never Smoker   . Smokeless tobacco: Never Used  . Alcohol Use: No  . Drug Use: No  . Sexual Activity: Yes    Birth Control/ Protection: Pill   Other Topics Concern  . Not on file   Social History Narrative  . No narrative on file       Review of Systems  Constitutional: Positive for fever, chills and fatigue.       Has been taking 800 mg ibuprofen daily for uterine  fibroids.   HENT: Positive for congestion, postnasal drip, sinus pressure and sneezing. Negative for ear pain and sore throat.        Head and chest congestion.   Respiratory: Positive for cough. Negative for shortness of breath and wheezing.        Cough sounds productive, but cannot get any sputum out. Cough is worse first thing in the morning.   Gastrointestinal: Negative for nausea, vomiting and diarrhea.  Musculoskeletal: Negative for myalgias.  Neurological: Positive for light-headedness.       Headache is located along the frontal sinus.  Lightheadness is associated with sneezing and lasts 15-30 seconds.        Objective:   Physical Exam  Physical Exam: Blood pressure 110/62, pulse 82, temperature  99.1 F (37.3 C), temperature source Oral, resp. rate 16, height 5' 3.75" (1.619 m), weight 121 lb 3.2 oz (54.976 kg), last menstrual period 11/28/2013, SpO2 100.00%., Body mass index is 20.97 kg/(m^2). General: Well developed, well nourished, in no acute distress. Head: Normocephalic, atraumatic, eyes without discharge, sclera non-icteric, nares are congested. Bilateral auditory canals clear, TM's are without perforation, pearly grey with reflective cone of light bilaterally. No sinus TTP. Oral cavity moist, dentition normal. Posterior pharynx with post nasal drip and mild erythema. No peritonsillar abscess or tonsillar exudate. Uvula midline.  Neck: Supple. No thyromegaly. Full ROM. No lymphadenopathy. Lungs: Coarse breath sounds bilaterally with mild wheezing bilaterally. No rales or rhonchi. Breathing is unlabored. Status post albuterol and Atrovent neb: Patient does feel better. Breath sounds are improved.  Heart: RRR with S1 S2. No murmurs, rubs, or gallops appreciated. Msk:  Strength and tone normal for age. Extremities: No clubbing or cyanosis. No edema. Neuro: Alert and oriented X 3. Moves all extremities spontaneously. CNII-XII grossly in tact. Psych:  Responds to questions appropriately with a normal affect.   Labs:  Results for orders placed in visit on 12/23/13  POCT INFLUENZA A/B      Result Value Range   Influenza A, POC Negative     Influenza B, POC Negative     CXR:  [facility] reading (PRIMARY) by  Dr. Joseph Art. Hyperinflation, otherwise negative.      Assessment & Plan:  53 year old female with acute asthma exacerbation and cough secondary to URI.  -Azithromycin 250 MG #6 2 po first day then 1 po next 4 days no RF -Hycodan #4oz 1 tsp po q 4-6 hours prn cough no RF SED -Prednisone 20 mg #12 3x2, 2x2, 1x2 no RF -Continue current asthma medications -Rest/fluids -RTC precautions    Christell Faith, MHS, PA-C [facility] [place], [place]  [id number] [address] 12/23/2013 4:56 PM

## 2014-01-12 ENCOUNTER — Other Ambulatory Visit: Payer: Self-pay | Admitting: Emergency Medicine

## 2014-01-21 ENCOUNTER — Other Ambulatory Visit: Payer: Self-pay | Admitting: Physician Assistant

## 2014-02-17 ENCOUNTER — Ambulatory Visit (INDEPENDENT_AMBULATORY_CARE_PROVIDER_SITE_OTHER): Payer: BC Managed Care – PPO | Admitting: Physician Assistant

## 2014-02-17 DIAGNOSIS — Z23 Encounter for immunization: Secondary | ICD-10-CM

## 2014-02-17 NOTE — Progress Notes (Signed)
Pt here for her #3 Hep B.

## 2014-03-05 ENCOUNTER — Other Ambulatory Visit: Payer: Self-pay | Admitting: Gynecology

## 2014-05-02 ENCOUNTER — Other Ambulatory Visit: Payer: Self-pay | Admitting: Emergency Medicine

## 2014-05-08 ENCOUNTER — Encounter: Payer: Self-pay | Admitting: Gynecology

## 2014-05-08 ENCOUNTER — Other Ambulatory Visit (HOSPITAL_COMMUNITY)
Admission: RE | Admit: 2014-05-08 | Discharge: 2014-05-08 | Disposition: A | Discharge status: Home / Self Care | Payer: BC Managed Care – PPO | Source: Ambulatory Visit | Attending: Gynecology | Admitting: Gynecology

## 2014-05-08 ENCOUNTER — Ambulatory Visit (INDEPENDENT_AMBULATORY_CARE_PROVIDER_SITE_OTHER): Payer: BC Managed Care – PPO | Admitting: Gynecology

## 2014-05-08 VITALS — BP 114/74 | Ht 63.0 in | Wt 126.0 lb

## 2014-05-08 DIAGNOSIS — Z01419 Encounter for gynecological examination (general) (routine) without abnormal findings: Secondary | ICD-10-CM

## 2014-05-08 DIAGNOSIS — Z309 Encounter for contraceptive management, unspecified: Secondary | ICD-10-CM

## 2014-05-08 DIAGNOSIS — Z124 Encounter for screening for malignant neoplasm of cervix: Secondary | ICD-10-CM | POA: Insufficient documentation

## 2014-05-08 DIAGNOSIS — D251 Intramural leiomyoma of uterus: Secondary | ICD-10-CM

## 2014-05-08 DIAGNOSIS — Z1151 Encounter for screening for human papillomavirus (HPV): Secondary | ICD-10-CM | POA: Insufficient documentation

## 2014-05-08 MED ORDER — NORETHIN ACE-ETH ESTRAD-FE 1-20 MG-MCG(24) PO CHEW: CHEWABLE_TABLET | ORAL | Status: DC

## 2014-05-08 NOTE — Progress Notes (Signed)
Tiffany Cline [birth year] [id number]        53 y.o.  G0P0 for annual exam.  Several issues noted below.  Past medical history,surgical history, problem list, medications, allergies, family history and social history were all reviewed and documented as reviewed in the EPIC chart.  ROS:  12 system ROS performed with pertinent positives and negatives included in the history, assessment and plan.  Included Systems: General, HEENT, Neck, Cardiovascular, Pulmonary, Gastrointestinal, Genitourinary, Musculoskeletal, Dermatologic, Endocrine, Hematological, Neurologic, Psychiatric Additional significant findings :  None   Exam: Kim assistant Filed Vitals:   05/08/14 1554  BP: 114/74  Height: 5\' 3"  (1.6 m)  Weight: 126 lb (57.153 kg)   General appearance:  Normal affect, orientation and appearance. Skin: Grossly normal HEENT: Without gross lesions.  No cervical or supraclavicular adenopathy. Thyroid normal.  Lungs:  Clear without wheezing, rales or rhonchi Cardiac: RR, without RMG Abdominal:  Soft, nontender, without masses, guarding, rebound, organomegaly or hernia Breasts:  Examined lying and sitting without masses, retractions, discharge or axillary adenopathy. Pelvic:  Ext/BUS/vagina normal  Cervix normal. Pap/HPV  Uterus bulky midline mobile nontender consistent with leiomyoma  Adnexa  Without masses or tenderness    Anus and perineum  Normal   Rectovaginal  Normal sphincter tone without palpated masses or tenderness.    Assessment/Plan:  53 y.o. G0P0 female for annual exam with regular menses, oral contraceptives.   1. Contraceptive management. Patient continues on her Loestrin 120 equivalents BCP. Doing well without complaints. We have discussed on multiple occasions the risks to include stroke heart attack DVT. No menopausal symptoms at the end of her pill free week. Patient's comfortable with continuing. I did ask her return at the end of her pill free week for an [facility]. If climbing then  will consider stopping the pill. If normal will continue for another year. Future order placed and she'll come by to do this. 2. Leiomyoma. Patient's exam remains stable with a bulky uterus. She is asymptomatic without pressure bleeding or other symptoms. She is comfortable with expectant management. 3. Pap smear 2012. Pap/HPV today. No history of abnormal Pap smears previously. Plan repeat Pap smear at 3-5 year interval assuming this Pap smear is normal per current screening guidelines. 4. Colonoscopy never. Patient knows she needs to schedule and is planning to arrange this year. 5. Health maintenance. No routine blood work done as this is all done through Dr. Caren Griffins office. Followup for [facility] otherwise annually.   Note: This document was prepared with digital dictation and possible smart phrase technology. Any transcriptional errors that result from this process are unintentional.   Anastasio Auerbach MD, 4:21 PM 05/08/2014

## 2014-05-08 NOTE — Patient Instructions (Signed)
Followup at the end of your pill free week to have your blood drawn to see about menopause. Followup in one year for annual exam. Arrange for your colonoscopy.  You may obtain a copy of any labs that were done today by logging onto MyChart as outlined in the instructions provided with your AVS (after visit summary). The office will not call with normal lab results but certainly if there are any significant abnormalities then we will contact you.   Health Maintenance, Female A healthy lifestyle and preventative care can promote health and wellness.  Maintain regular health, dental, and eye exams.  Eat a healthy diet. Foods like vegetables, fruits, whole grains, low-fat dairy products, and lean protein foods contain the nutrients you need without too many calories. Decrease your intake of foods high in solid fats, added sugars, and salt. Get information about a proper diet from your caregiver, if necessary.  Regular physical exercise is one of the most important things you can do for your health. Most adults should get at least 150 minutes of moderate-intensity exercise (any activity that increases your heart rate and causes you to sweat) each week. In addition, most adults need muscle-strengthening exercises on 2 or more days a week.   Maintain a healthy weight. The body mass index (BMI) is a screening tool to identify possible weight problems. It provides an estimate of body fat based on height and weight. Your caregiver can help determine your BMI, and can help you achieve or maintain a healthy weight. For adults 20 years and older:  A BMI below 18.5 is considered underweight.  A BMI of 18.5 to 24.9 is normal.  A BMI of 25 to 29.9 is considered overweight.  A BMI of 30 and above is considered obese.  Maintain normal blood lipids and cholesterol by exercising and minimizing your intake of saturated fat. Eat a balanced diet with plenty of fruits and vegetables. Blood tests for lipids and  cholesterol should begin at age 56 and be repeated every 5 years. If your lipid or cholesterol levels are high, you are over 50, or you are a high risk for heart disease, you may need your cholesterol levels checked more frequently.Ongoing high lipid and cholesterol levels should be treated with medicines if diet and exercise are not effective.  If you smoke, find out from your caregiver how to quit. If you do not use tobacco, do not start.  Lung cancer screening is recommended for adults aged 38 80 years who are at high risk for developing lung cancer because of a history of smoking. Yearly low-dose computed tomography (CT) is recommended for people who have at least a 30-pack-year history of smoking and are a current smoker or have quit within the past 15 years. A pack year of smoking is smoking an average of 1 pack of cigarettes a day for 1 year (for example: 1 pack a day for 30 years or 2 packs a day for 15 years). Yearly screening should continue until the smoker has stopped smoking for at least 15 years. Yearly screening should also be stopped for people who develop a health problem that would prevent them from having lung cancer treatment.  If you are pregnant, do not drink alcohol. If you are breastfeeding, be very cautious about drinking alcohol. If you are not pregnant and choose to drink alcohol, do not exceed 1 drink per day. One drink is considered to be 12 ounces (355 mL) of beer, 5 ounces (148 mL) of wine,  or 1.5 ounces (44 mL) of liquor.  Avoid use of street drugs. Do not share needles with anyone. Ask for help if you need support or instructions about stopping the use of drugs.  High blood pressure causes heart disease and increases the risk of stroke. Blood pressure should be checked at least every 1 to 2 years. Ongoing high blood pressure should be treated with medicines, if weight loss and exercise are not effective.  If you are 4 to 51 years old, ask your caregiver if you should  take aspirin to prevent strokes.  Diabetes screening involves taking a blood sample to check your fasting blood sugar level. This should be done once every 3 years, after age 9, if you are within normal weight and without risk factors for diabetes. Testing should be considered at a younger age or be carried out more frequently if you are overweight and have at least 1 risk factor for diabetes.  Breast cancer screening is essential preventative care for women. You should practice "breast self-awareness." This means understanding the normal appearance and feel of your breasts and may include breast self-examination. Any changes detected, no matter how small, should be reported to a caregiver. Women in their 60s and 30s should have a clinical breast exam (CBE) by a caregiver as part of a regular health exam every 1 to 3 years. After age 28, women should have a CBE every year. Starting at age 76, women should consider having a mammogram (breast X-ray) every year. Women who have a family history of breast cancer should talk to their caregiver about genetic screening. Women at a high risk of breast cancer should talk to their caregiver about having an MRI and a mammogram every year.  Breast cancer gene (BRCA)-related cancer risk assessment is recommended for women who have family members with BRCA-related cancers. BRCA-related cancers include breast, ovarian, tubal, and peritoneal cancers. Having family members with these cancers may be associated with an increased risk for harmful changes (mutations) in the breast cancer genes BRCA1 and BRCA2. Results of the assessment will determine the need for genetic counseling and BRCA1 and BRCA2 testing.  The Pap test is a screening test for cervical cancer. Women should have a Pap test starting at age 10. Between ages 44 and 65, Pap tests should be repeated every 2 years. Beginning at age 27, you should have a Pap test every 3 years as long as the past 3 Pap tests have  been normal. If you had a hysterectomy for a problem that was not cancer or a condition that could lead to cancer, then you no longer need Pap tests. If you are between ages 40 and 28, and you have had normal Pap tests going back 10 years, you no longer need Pap tests. If you have had past treatment for cervical cancer or a condition that could lead to cancer, you need Pap tests and screening for cancer for at least 20 years after your treatment. If Pap tests have been discontinued, risk factors (such as a new sexual partner) need to be reassessed to determine if screening should be resumed. Some women have medical problems that increase the chance of getting cervical cancer. In these cases, your caregiver may recommend more frequent screening and Pap tests.  The human papillomavirus (HPV) test is an additional test that may be used for cervical cancer screening. The HPV test looks for the virus that can cause the cell changes on the cervix. The cells collected during the  Pap test can be tested for HPV. The HPV test could be used to screen women aged 23 years and older, and should be used in women of any age who have unclear Pap test results. After the age of 59, women should have HPV testing at the same frequency as a Pap test.  Colorectal cancer can be detected and often prevented. Most routine colorectal cancer screening begins at the age of 28 and continues through age 51. However, your caregiver may recommend screening at an earlier age if you have risk factors for colon cancer. On a yearly basis, your caregiver may provide home test kits to check for hidden blood in the stool. Use of a small camera at the end of a tube, to directly examine the colon (sigmoidoscopy or colonoscopy), can detect the earliest forms of colorectal cancer. Talk to your caregiver about this at age 55, when routine screening begins. Direct examination of the colon should be repeated every 5 to 10 years through age 25, unless early  forms of pre-cancerous polyps or small growths are found.  Hepatitis C blood testing is recommended for all people born from [birth year] through 1963 and any individual with known risks for hepatitis C.  Practice safe sex. Use condoms and avoid high-risk sexual practices to reduce the spread of sexually transmitted infections (STIs). Sexually active women aged 44 and younger should be checked for Chlamydia, which is a common sexually transmitted infection. Older women with new or multiple partners should also be tested for Chlamydia. Testing for other STIs is recommended if you are sexually active and at increased risk.  Osteoporosis is a disease in which the bones lose minerals and strength with aging. This can result in serious bone fractures. The risk of osteoporosis can be identified using a bone density scan. Women ages 76 and over and women at risk for fractures or osteoporosis should discuss screening with their caregivers. Ask your caregiver whether you should be taking a calcium supplement or vitamin D to reduce the rate of osteoporosis.  Menopause can be associated with physical symptoms and risks. Hormone replacement therapy is available to decrease symptoms and risks. You should talk to your caregiver about whether hormone replacement therapy is right for you.  Use sunscreen. Apply sunscreen liberally and repeatedly throughout the day. You should seek shade when your shadow is shorter than you. Protect yourself by wearing long sleeves, pants, a wide-brimmed hat, and sunglasses year round, whenever you are outdoors.  Notify your caregiver of new moles or changes in moles, especially if there is a change in shape or color. Also notify your caregiver if a mole is larger than the size of a pencil eraser.  Stay current with your immunizations. Document Released: 05/29/2011 Document Revised: 03/10/2013 Document Reviewed: 05/29/2011 [facility] Patient Information 2014 [place].

## 2014-05-08 NOTE — Addendum Note (Signed)
Addended by: Nelva Nay on: 05/08/2014 04:27 PM   Modules accepted: Orders

## 2014-05-09 LAB — URINALYSIS W MICROSCOPIC + REFLEX CULTURE
Bacteria, UA: NONE SEEN
Bilirubin Urine: NEGATIVE
Casts: NONE SEEN
Crystals: NONE SEEN
Glucose, UA: NEGATIVE mg/dL
Hgb urine dipstick: NEGATIVE
Ketones, ur: NEGATIVE mg/dL
Leukocytes, UA: NEGATIVE
Nitrite: NEGATIVE
Protein, ur: NEGATIVE mg/dL
Specific Gravity, Urine: 1.02 (ref 1.005–1.030)
Squamous Epithelial / LPF: NONE SEEN
Urobilinogen, UA: 0.2 mg/dL (ref 0.0–1.0)
pH: 5.5 (ref 5.0–8.0)

## 2014-05-14 LAB — CYTOLOGY - PAP

## 2014-05-31 ENCOUNTER — Other Ambulatory Visit: Payer: Self-pay | Admitting: Emergency Medicine

## 2014-06-08 ENCOUNTER — Other Ambulatory Visit: Payer: Self-pay | Admitting: Emergency Medicine

## 2014-06-08 NOTE — Telephone Encounter (Signed)
Needs office visit.

## 2014-06-30 ENCOUNTER — Ambulatory Visit (INDEPENDENT_AMBULATORY_CARE_PROVIDER_SITE_OTHER): Payer: BC Managed Care – PPO | Admitting: Emergency Medicine

## 2014-06-30 ENCOUNTER — Encounter: Payer: Self-pay | Admitting: Emergency Medicine

## 2014-06-30 VITALS — BP 126/80 | HR 84 | Temp 98.7°F | Resp 16 | Ht 63.0 in | Wt 127.0 lb

## 2014-06-30 DIAGNOSIS — Z1211 Encounter for screening for malignant neoplasm of colon: Secondary | ICD-10-CM

## 2014-06-30 DIAGNOSIS — J45909 Unspecified asthma, uncomplicated: Secondary | ICD-10-CM | POA: Insufficient documentation

## 2014-06-30 DIAGNOSIS — J4530 Mild persistent asthma, uncomplicated: Secondary | ICD-10-CM

## 2014-06-30 DIAGNOSIS — I10 Essential (primary) hypertension: Secondary | ICD-10-CM

## 2014-06-30 LAB — COMPLETE METABOLIC PANEL WITH GFR
ALT: 14 U/L (ref 0–35)
AST: 15 U/L (ref 0–37)
Albumin: 3.5 g/dL (ref 3.5–5.2)
Alkaline Phosphatase: 34 U/L — ABNORMAL LOW (ref 39–117)
BUN: 11 mg/dL (ref 6–23)
CO2: 30 mEq/L (ref 19–32)
Calcium: 8.6 mg/dL (ref 8.4–10.5)
Chloride: 100 mEq/L (ref 96–112)
Creat: 0.75 mg/dL (ref 0.50–1.10)
GFR, Est African American: >89 mL/min
GFR, Est Non African American: >89 mL/min
Glucose, Bld: 91 mg/dL (ref 70–99)
Potassium: 3.6 mEq/L (ref 3.5–5.3)
Sodium: 138 mEq/L (ref 135–145)
Total Bilirubin: 0.4 mg/dL (ref 0.2–1.2)
Total Protein: 6.4 g/dL (ref 6.0–8.3)

## 2014-06-30 LAB — CBC WITH DIFFERENTIAL/PLATELET
Basophils Absolute: 0 10*3/uL (ref 0.0–0.1)
Basophils Relative: 0 % (ref 0–1)
Eosinophils Absolute: 0.1 10*3/uL (ref 0.0–0.7)
Eosinophils Relative: 2 % (ref 0–5)
HCT: 39 % (ref 36.0–46.0)
Hemoglobin: 13.3 g/dL (ref 12.0–15.0)
Lymphocytes Relative: 26 % (ref 12–46)
Lymphs Abs: 1.6 10*3/uL (ref 0.7–4.0)
MCH: 28.6 pg (ref 26.0–34.0)
MCHC: 34.1 g/dL (ref 30.0–36.0)
MCV: 83.9 fL (ref 78.0–100.0)
Monocytes Absolute: 0.6 10*3/uL (ref 0.1–1.0)
Monocytes Relative: 9 % (ref 3–12)
Neutro Abs: 3.9 10*3/uL (ref 1.7–7.7)
Neutrophils Relative %: 63 % (ref 43–77)
Platelets: 296 10*3/uL (ref 150–400)
RBC: 4.65 MIL/uL (ref 3.87–5.11)
RDW: 14.4 % (ref 11.5–15.5)
WBC: 6.2 10*3/uL (ref 4.0–10.5)

## 2014-06-30 LAB — TSH: TSH: 0.505 u[IU]/mL (ref 0.350–4.500)

## 2014-06-30 MED ORDER — ALBUTEROL SULFATE HFA 108 (90 BASE) MCG/ACT IN AERS: INHALATION_SPRAY | RESPIRATORY_TRACT | Status: DC

## 2014-06-30 MED ORDER — AMLODIPINE BESYLATE 5 MG PO TABS: ORAL_TABLET | ORAL | Status: DC

## 2014-06-30 MED ORDER — FLUTICASONE PROPIONATE 50 MCG/ACT NA SUSP: NASAL | Status: DC

## 2014-06-30 MED ORDER — AZELASTINE HCL 0.15 % NA SOLN: NASAL | Status: DC

## 2014-06-30 MED ORDER — MOMETASONE FURO-FORMOTEROL FUM 200-5 MCG/ACT IN AERO: INHALATION_SPRAY | RESPIRATORY_TRACT | Status: DC

## 2014-06-30 NOTE — Progress Notes (Signed)
Subjective:  This chart was scribed for Tiffany Queen, MD by Tiffany Cline, Medical Scribe. This patient was seen in Room 21 and the patient's care was started at 11:55 AM.   Patient ID: Tiffany Cline, female    DOB: 1961-08-26, 53 y.o.   MRN: [id number]  HPI HPI Comments: Tiffany Cline is a 53 y.o. female with a history of allergies and hypertension who presents to the [facility] for a follow-up concerning her blood pressure and asthma.  She was last seen by Dr. Everlene Farrier a year ago.  She has a GYN physician she sees regularly.  She finished her Hepatitis B series.  Her last TDAP was 2-3 years ago when she cut her finger.  She currently takes Amlodipine, Guaifenesin, birth control, and a handful of vitamins in the morning.  At lunch she takes her vitamins and at night she takes her Guaifenesis and Singulair as needed.  She does not use Flonase or Atepro.  She has been taking her Dulera twice a day which is abnormal for her.  She denies experiencing any acid reflux symptoms but states that she will wake up coughing throughout the night.  She believes her symptoms may be caused by her asthma and she will experience relief to her symptoms when she takes [employer].  She has not received a colonoscopy this year.  She also needs a refill of her Amlodopine, Dulera, Singulair, and Proair.    Her mother has a history of DM and her father has hypertension.    She is working in [place] and her job recently shortened her hours and added one day of work to her schedule.  Her father lives in [place], [place] and her aunt lives in [place], [place] and she takes care of both of them.  She went to [place], [place], and [place] for vacation this year.       Past Medical History  Diagnosis Date   Hypertension    Leiomyoma    Allergy    Asthma     induced by cold and allergies   Past Surgical History  Procedure Laterality Date   Myomectomy  2001   Family History  Problem Relation Age of Onset     Diabetes Mother    Hypertension Mother    Hypertension Father    Cancer Father     prostate   Thyroid disease Father     benign tumor   Heart disease Maternal Grandmother    Hypertension Paternal Grandmother    Asthma Paternal Grandfather    History   Social History   Marital Status: Single    Spouse Name: [name]    Number of Children: [name]   Years of Education: [name]   Occupational History   Not on file.   Social History Main Topics   Smoking status: Never Smoker    Smokeless tobacco: Never Used   Alcohol Use: No   Drug Use: No   Sexual Activity: Yes    Birth Control/ Protection: Pill   Other Topics Concern   Not on file   Social History Narrative   No narrative on file   Allergies  Allergen Reactions   Septra [Sulfamethoxazole-Trimethoprim] Shortness Of Breath and Rash   Sulfa Antibiotics Rash    Review of Systems  Respiratory: Positive for cough.      Objective:  Physical Exam  Nursing note and vitals reviewed. Constitutional: She is oriented to person, place, and time. She appears well-developed and well-nourished.  HENT:  Head: Normocephalic and atraumatic.  Right Ear: Hearing, tympanic membrane, external ear and ear canal normal.  Left Ear: Hearing, tympanic membrane, external ear and ear canal normal.  Nose: Nose normal.  Mouth/Throat: Uvula is midline, oropharynx is clear and moist and mucous membranes are normal. No oropharyngeal exudate.  Eyes: Conjunctivae and EOM are normal. Pupils are equal, round, and reactive to light.  Neck: Normal range of motion. Neck supple. No thyromegaly present.  Cardiovascular: Normal rate, regular rhythm and normal heart sounds.  Exam reveals no gallop and no friction rub.   No murmur heard. Pulmonary/Chest: Effort normal and breath sounds normal. No respiratory distress. She has no wheezes. She has no rales.  Abdominal: Soft. There is no tenderness.  Musculoskeletal: Normal range of motion.   Lymphadenopathy:    She has no cervical adenopathy.  Neurological: She is alert and oriented to person, place, and time.  Skin: Skin is warm and dry.  Psychiatric: She has a normal mood and affect. Her behavior is normal.   Peak flow of 480 BP 126/80   Pulse 84   Temp(Src) 98.7 F (37.1 C) (Oral)   Resp 16   Ht 5\' 3"  (1.6 m)   Wt 127 lb (57.607 kg)   BMI 22.50 kg/m2   SpO2 100%   LMP 06/12/2014 Assessment & Plan:  Routine blood work done.  Meds refilled.  Will try Zantac at night for cough. No change in her asthma medications no change in her pressure medication  I personally performed the services described in this documentation, which was scribed in my presence. The recorded information has been reviewed and is accurate.

## 2014-07-14 ENCOUNTER — Other Ambulatory Visit: Payer: Self-pay | Admitting: Emergency Medicine

## 2014-07-15 NOTE — Telephone Encounter (Signed)
Dr Everlene Farrier, you saw this pt for med refills including singulair, and your plan calls for "no change in asthma meds", but the singulair wasn't ordered and it dissappeared off her med list at end of encounter. I just wanted to make sure you intended for her to cont it. Pended RFs for 1 yr as you did on other meds.

## 2014-08-04 ENCOUNTER — Encounter: Payer: Self-pay | Admitting: Radiology

## 2014-08-04 ENCOUNTER — Encounter: Payer: Self-pay | Admitting: Emergency Medicine

## 2014-09-10 ENCOUNTER — Ambulatory Visit (INDEPENDENT_AMBULATORY_CARE_PROVIDER_SITE_OTHER): Payer: BC Managed Care – PPO | Admitting: *Deleted

## 2014-09-10 DIAGNOSIS — Z23 Encounter for immunization: Secondary | ICD-10-CM

## 2014-10-29 ENCOUNTER — Ambulatory Visit (INDEPENDENT_AMBULATORY_CARE_PROVIDER_SITE_OTHER): Payer: BC Managed Care – PPO | Admitting: Emergency Medicine

## 2014-10-29 VITALS — BP 118/72 | HR 81 | Temp 98.8°F | Resp 16 | Wt 129.0 lb

## 2014-10-29 DIAGNOSIS — J4531 Mild persistent asthma with (acute) exacerbation: Secondary | ICD-10-CM

## 2014-10-29 MED ORDER — IPRATROPIUM BROMIDE 0.02 % IN SOLN
0.5000 mg | Freq: Once | RESPIRATORY_TRACT | Status: AC
  Administered 2014-10-29: 0.5 mg via RESPIRATORY_TRACT

## 2014-10-29 MED ORDER — ALBUTEROL SULFATE (2.5 MG/3ML) 0.083% IN NEBU
5.0000 mg | INHALATION_SOLUTION | Freq: Once | RESPIRATORY_TRACT | Status: AC
  Administered 2014-10-29: 5 mg via RESPIRATORY_TRACT

## 2014-10-29 MED ORDER — UNABLE TO FIND: Status: DC

## 2014-10-29 NOTE — Progress Notes (Signed)
[facility] [address], [place] [address] [phone]  Date:  10/29/2014   Name:  Tiffany Cline   DOB:  01/30/1961   MRN:  [id number]  PCP:  Jenny Reichmann, MD    Chief Complaint: Shortness of Breath; Asthma; and Labs ordered   History of Present Illness:  Tiffany Cline is a 53 y.o. very pleasant female patient who presents with the following:  History of asthma.  Uses dulera and proair.  Not able to sleep due wheezing. Moderate exertional shortness of breath . Has mucoid nasal drainage and sputum production No chest pain, nausea or vomiting No fever or chills No improvement with over the counter medications or other home remedies.  Denies other complaint or health concern today.   Patient Active Problem List   Diagnosis Date Noted  . Asthma, chronic 06/30/2014  . Unspecified essential hypertension 05/27/2013  . Allergic rhinitis 05/27/2013    Past Medical History  Diagnosis Date  . Hypertension   . Leiomyoma   . Allergy   . Asthma     induced by cold and allergies    Past Surgical History  Procedure Laterality Date  . Myomectomy  2001    History  Substance Use Topics  . Smoking status: Never Smoker   . Smokeless tobacco: Never Used  . Alcohol Use: No    Family History  Problem Relation Age of Onset  . Diabetes Mother   . Hypertension Mother   . Hypertension Father   . Cancer Father     prostate  . Thyroid disease Father     benign tumor  . Heart disease Maternal Grandmother   . Hypertension Paternal Grandmother   . Asthma Paternal Grandfather     Allergies  Allergen Reactions  . Septra [Sulfamethoxazole-Trimethoprim] Shortness Of Breath and Rash  . Sulfa Antibiotics Rash    Medication list has been reviewed and updated.  Current Outpatient Prescriptions on File Prior to Visit  Medication Sig Dispense Refill  . albuterol (PROAIR HFA) 108 (90 BASE) MCG/ACT inhaler INHALE TWO PUFFS BY MOUTH EVERY 4 HOURS AS NEEDED FOR  WHEEZING, COUGH OR SHORTNESS OF BREATH 1 Inhaler 11  . amLODipine (NORVASC) 5 MG tablet One tablet daily 30 tablet 11  . Azelastine HCl (ASTEPRO) 0.15 % SOLN USE ONE TO TWO SPRAY IN EACH NOSTRIL TWICE DAILY 30 mL 11  . fluticasone (FLONASE) 50 MCG/ACT nasal spray USE TWO SPRAY(S) IN EACH NOSTRIL ONCE DAILY 16 g 11  . GuaiFENesin (MUCINEX PO) Take by mouth.      . mometasone-formoterol (DULERA) 200-5 MCG/ACT AERO INHALE TWO PUFFS INTO LUNGS ONCE DAILY 13 g 11  . montelukast (SINGULAIR) 10 MG tablet TAKE ONE TABLET BY MOUTH  DAILY AT BEDTIME 30 tablet 11  . Norethin Ace-Eth Estrad-FE (MINASTRIN 24 FE) 1-20 MG-MCG(24) CHEW CHEW AND SWALLOW ONE TABLET DAILY 28 tablet 12   Current Facility-Administered Medications on File Prior to Visit  Medication Dose Route Frequency Provider Last Rate Last Dose  . albuterol (PROVENTIL) (2.5 MG/3ML) 0.083% nebulizer solution 2.5 mg  2.5 mg Nebulization Once Ryan M Dunn, PA-C      . ipratropium (ATROVENT) nebulizer solution 0.5 mg  0.5 mg Nebulization Once Rise Mu, PA-C        Review of Systems:  As per HPI, otherwise negative.    Physical Examination: Filed Vitals:   10/29/14 0853  BP: 118/72  Pulse: 81  Temp: 98.8 F (37.1 C)  Resp: 16  Filed Vitals:   10/29/14 0853  Weight: 129 lb (58.514 kg)   Body mass index is 22.86 kg/(m^2). Ideal Body Weight:    GEN: WDWN, NAD, Non-toxic, A & O x 3 HEENT: Atraumatic, Normocephalic. Neck supple. No masses, No LAD. Ears and Nose: No external deformity. CV: RRR, No M/G/R. No JVD. No thrill. No extra heart sounds. PULM: scattered wheezes, no crackles, rhonchi. No retractions. No resp. distress. No accessory muscle use. ABD: S, NT, ND, +BS. No rebound. No HSM. EXTR: No c/c/e NEURO Normal gait.  PSYCH: Normally interactive. Conversant. Not depressed or anxious appearing.  Calm demeanor.    Assessment and Plan: Exacerbation asthma Improved air movement and sputum mobilization with neb  Signed,   Ellison Carwin, MD

## 2014-10-29 NOTE — Addendum Note (Signed)
Addended by: Frutoso Chase A on: 10/29/2014 11:18 AM   Modules accepted: Orders

## 2014-10-29 NOTE — Addendum Note (Signed)
Addended by: Elwyn Reach A on: 10/29/2014 10:18 AM   Modules accepted: Orders

## 2014-10-29 NOTE — Patient Instructions (Signed)

## 2014-11-17 ENCOUNTER — Telehealth: Payer: Self-pay

## 2014-11-17 NOTE — Telephone Encounter (Signed)
Dr Ouida Sills, I got a fax from [facility] asking for Rx for a nebulizer medication. They got the Rx for the machine, but no medication.

## 2014-12-22 ENCOUNTER — Ambulatory Visit (INDEPENDENT_AMBULATORY_CARE_PROVIDER_SITE_OTHER): Payer: BLUE CROSS/BLUE SHIELD | Admitting: Family Medicine

## 2014-12-22 ENCOUNTER — Ambulatory Visit (INDEPENDENT_AMBULATORY_CARE_PROVIDER_SITE_OTHER): Payer: BLUE CROSS/BLUE SHIELD

## 2014-12-22 VITALS — BP 108/70 | HR 77 | Temp 98.3°F | Resp 18 | Ht 63.0 in | Wt 135.0 lb

## 2014-12-22 DIAGNOSIS — M79609 Pain in unspecified limb: Secondary | ICD-10-CM

## 2014-12-22 DIAGNOSIS — R202 Paresthesia of skin: Secondary | ICD-10-CM

## 2014-12-22 DIAGNOSIS — M542 Cervicalgia: Secondary | ICD-10-CM

## 2014-12-22 MED ORDER — CYCLOBENZAPRINE HCL 10 MG PO TABS: 10.0000 mg | ORAL_TABLET | Freq: Three times a day (TID) | ORAL | Status: DC | PRN

## 2014-12-22 MED ORDER — PREDNISONE 20 MG PO TABS: ORAL_TABLET | ORAL | Status: DC

## 2014-12-22 NOTE — Progress Notes (Signed)
IDENTIFYING INFORMATION  Tiffany Cline / DOB: 25-Dec-1960 / MRN: [id number]  The patient has Unspecified essential hypertension; Allergic rhinitis; and Asthma, chronic on her problem list.  SUBJECTIVE  CC: Neck Pain and Shoulder Pain   HPI: Tiffany Cline is a 54 y.o. female presenting for neck pain times two weeks.  She reports it started with a crick in the neck.  She tried ibuprofen 200 mg x 2 and it felt better.  The next day she woke up with bilateral neck pain which has not improved with Aleve and is now complaining of paresthesia in the 3rd-5th finger tips of the left hand.  She denies the loss of bowel or bladder and does not complain of saddle paresthesia. She does not complain of a loss in strength.   She  has a past medical history of Hypertension; Leiomyoma; Allergy; and Asthma.    She has a current medication list which includes the following prescription(s): albuterol, amlodipine, azelastine hcl, fluticasone, guaifenesin, mometasone-formoterol, montelukast, norethin ace-eth estrad-fe, and UNABLE TO FIND, and the following Facility-Administered Medications: ipratropium.  Tiffany Cline is allergic to septra and sulfa antibiotics. She  reports that she has never smoked. She has never used smokeless tobacco. She reports that she does not drink alcohol or use illicit drugs. She  reports that she currently engages in sexual activity. She reports using the following method of birth control/protection: Pill.  The patient  has past surgical history that includes Myomectomy (2001).  Her family history includes Asthma in her paternal grandfather; Cancer in her father; Diabetes in her mother; Heart disease in her maternal grandmother; Hypertension in her father, mother, and paternal grandmother; Thyroid disease in her father.  Review of Systems  Constitutional: Negative for fever and chills.  Respiratory: Negative for cough.   Musculoskeletal: Positive for myalgias and neck pain. Negative for  back pain, joint pain and falls.  Skin: Negative for itching and rash.  Neurological: Negative for headaches.    OBJECTIVE  Blood pressure 108/70, pulse 77, temperature 98.3 F (36.8 C), temperature source Oral, resp. rate 18, height 5\' 3"  (1.6 m), weight 135 lb (61.236 kg), last menstrual period 11/24/2014, SpO2 99 %. The patient's body mass index is 23.92 kg/(m^2).  Physical Exam  Musculoskeletal:       Right shoulder: Normal. She exhibits normal range of motion, no spasm and normal strength.       Left shoulder: She exhibits normal range of motion, no spasm and normal strength.       Cervical back: She exhibits tenderness and spasm. She exhibits normal range of motion, no bony tenderness, no swelling and no edema.  Neurological: She is alert. She has normal strength. No cranial nerve deficit or sensory deficit.  Reflex Scores:      Tricep reflexes are 2+ on the right side and 2+ on the left side.      Bicep reflexes are 1+ on the right side and 2+ on the left side.      Brachioradialis reflexes are 2+ on the right side and 2+ on the left side.  [facility] reading (PRIMARY) by  Dr. Joseph Art:  The is a loss of the cervical lordosis. Disk space appears normal throughout the cervical vertebrae.  No acute body abnormalities.   No results found for this or any previous visit (from the past 24 hour(s)).  ASSESSMENT & PLAN  1. Neck pain - PO steroid taper, flexeril - DG Cervical Spine 2 or 3 views; Future  2. Paresthesia  and pain of left extremity - PO steroid taper - DG Cervical Spine 2 or 3 views; Future   The patient was instructed to to call or comeback to clinic as needed, or should symptoms warrant.  Philis Fendt, MHS, PA-C [facility] 12/22/2014 8:18 PM

## 2014-12-28 ENCOUNTER — Other Ambulatory Visit: Payer: Self-pay | Admitting: Physician Assistant

## 2014-12-29 ENCOUNTER — Other Ambulatory Visit: Payer: Self-pay

## 2014-12-29 DIAGNOSIS — Z1231 Encounter for screening mammogram for malignant neoplasm of breast: Secondary | ICD-10-CM

## 2015-01-18 ENCOUNTER — Other Ambulatory Visit: Payer: Self-pay | Admitting: Physician Assistant

## 2015-01-18 DIAGNOSIS — M542 Cervicalgia: Secondary | ICD-10-CM

## 2015-01-19 ENCOUNTER — Other Ambulatory Visit: Payer: Self-pay

## 2015-01-19 ENCOUNTER — Ambulatory Visit
Admission: RE | Admit: 2015-01-19 | Discharge: 2015-01-19 | Disposition: A | Discharge status: Home / Self Care | Payer: BLUE CROSS/BLUE SHIELD | Source: Ambulatory Visit

## 2015-01-19 DIAGNOSIS — Z1231 Encounter for screening mammogram for malignant neoplasm of breast: Secondary | ICD-10-CM

## 2015-01-20 NOTE — Telephone Encounter (Signed)
Legrand Como, do you want to give RFs?

## 2015-02-04 ENCOUNTER — Other Ambulatory Visit: Payer: Self-pay

## 2015-02-04 MED ORDER — MOMETASONE FURO-FORMOTEROL FUM 200-5 MCG/ACT IN AERO: INHALATION_SPRAY | RESPIRATORY_TRACT | Status: DC

## 2015-06-02 ENCOUNTER — Ambulatory Visit (INDEPENDENT_AMBULATORY_CARE_PROVIDER_SITE_OTHER): Payer: BLUE CROSS/BLUE SHIELD | Admitting: Emergency Medicine

## 2015-06-02 VITALS — BP 130/80 | HR 83 | Temp 98.2°F | Resp 17 | Ht 63.0 in | Wt 134.0 lb

## 2015-06-02 DIAGNOSIS — I10 Essential (primary) hypertension: Secondary | ICD-10-CM | POA: Diagnosis not present

## 2015-06-02 DIAGNOSIS — J4541 Moderate persistent asthma with (acute) exacerbation: Secondary | ICD-10-CM | POA: Diagnosis not present

## 2015-06-02 DIAGNOSIS — Z23 Encounter for immunization: Secondary | ICD-10-CM | POA: Diagnosis not present

## 2015-06-02 MED ORDER — ALBUTEROL SULFATE HFA 108 (90 BASE) MCG/ACT IN AERS: INHALATION_SPRAY | RESPIRATORY_TRACT | Status: DC

## 2015-06-02 MED ORDER — MONTELUKAST SODIUM 10 MG PO TABS: ORAL_TABLET | ORAL | Status: DC

## 2015-06-02 MED ORDER — PNEUMOCOCCAL 13-VAL CONJ VACC IM SUSP
0.5000 mL | Freq: Once | INTRAMUSCULAR | Status: AC
  Administered 2015-06-02: 0.5 mL via INTRAMUSCULAR

## 2015-06-02 MED ORDER — FLUTICASONE PROPIONATE 50 MCG/ACT NA SUSP: NASAL | Status: DC

## 2015-06-02 MED ORDER — AMLODIPINE BESYLATE 5 MG PO TABS: ORAL_TABLET | ORAL | Status: DC

## 2015-06-02 MED ORDER — MOMETASONE FURO-FORMOTEROL FUM 200-5 MCG/ACT IN AERO: INHALATION_SPRAY | RESPIRATORY_TRACT | Status: DC

## 2015-06-02 NOTE — Progress Notes (Signed)
Subjective:  This chart was scribed for Tiffany Jordan, MD by Thea Alken, ED Scribe. This patient was seen in room 3 and the patient's care was started at 11:07 AM.   Patient ID: Tiffany Cline, female    DOB: 1960-12-27, 54 y.o.   MRN: [id number]  HPI Chief Complaint  Patient presents with  . Asthma  . blood pressure check   HPI Comments: Tiffany Cline is a 54 y.o. female who presents to the [facility] complaining of asthma flare up. Pt reports allergies, which is one of her triggers, have been causing her asthma to flare up. She reports wheezing and chest tightness with warmer weather. Every morning she takes singular, dulara and norvac and in the afternoon she takes mucinex and dulera. She last had cholesterol checked last year along with her pap smear.Pt has not had colonoscopy but has not scheduled an appointment. Pt works as a Software engineer.   Pt also complains of possible wax build up in left ear. Pt has tried OTC wax removal without  Past Medical History  Diagnosis Date  . Hypertension   . Leiomyoma   . Allergy   . Asthma     induced by cold and allergies   Past Surgical History  Procedure Laterality Date  . Myomectomy  2001   Prior to Admission medications   Medication Sig Start Date End Date Taking? Authorizing Provider  albuterol (PROAIR HFA) 108 (90 BASE) MCG/ACT inhaler INHALE TWO PUFFS BY MOUTH EVERY 4 HOURS AS NEEDED FOR WHEEZING, COUGH OR SHORTNESS OF BREATH 06/30/14  Yes [name], MD  amLODipine (NORVASC) 5 MG tablet One tablet daily 06/30/14  Yes [name], MD  Azelastine HCl (ASTEPRO) 0.15 % SOLN USE ONE TO TWO SPRAY IN EACH NOSTRIL TWICE DAILY 06/30/14  Yes [name], MD  cyclobenzaprine (FLEXERIL) 10 MG tablet TAKE ONE TABLET BY MOUTH THREE TIMES DAILY AS NEEDED FOR MUSCLE SPASMS 01/20/15  Yes [name], PA-C  fluticasone (FLONASE) 50 MCG/ACT nasal spray USE TWO SPRAY(S) IN EACH NOSTRIL ONCE DAILY 06/30/14  Yes [name], MD    GuaiFENesin (MUCINEX PO) Take by mouth.     Yes Historical Provider, MD  mometasone-formoterol (DULERA) 200-5 MCG/ACT AERO INHALE TWO PUFFS INTO LUNGS ONCE DAILY 02/04/15  Yes [name], MD  montelukast (SINGULAIR) 10 MG tablet TAKE ONE TABLET BY MOUTH  DAILY AT BEDTIME 07/15/14  Yes [name], MD  Norethin Ace-Eth Estrad-FE (MINASTRIN 24 FE) 1-20 MG-MCG(24) CHEW CHEW AND SWALLOW ONE TABLET DAILY 05/08/14  Yes Anastasio Auerbach, MD  UNABLE TO FIND Nebulizer machine. Dx code J45.31 10/29/14  Yes [name], MD   Review of Systems  HENT: Negative for ear pain and hearing loss.   Respiratory: Positive for chest tightness and wheezing.    Objective:   Physical Exam CONSTITUTIONAL: Well developed/well nourished HEAD: Normocephalic/atraumatic EYES: EOMI/PERRL ENMT: Mucous membranes moist. Left ear cerumen impaction.  NECK: supple no meningeal signs SPINE/BACK:entire spine nontender CV: S1/S2 noted, no murmurs/rubs/gallops noted LUNGS: Lungs are clear to auscultation bilaterally, no apparent distress. No wheezing. ABDOMEN: soft, nontender, no rebound or guarding, bowel sounds noted throughout abdomen GU:no cva tenderness NEURO: Pt is awake/alert/appropriate, moves all extremitiesx4.  No facial droop.   EXTREMITIES: pulses normal/equal, full ROM SKIN: warm, color normal PSYCH: no abnormalities of mood noted, alert and oriented to situation   Filed Vitals:   06/02/15 1020  BP: 130/80  Pulse: 83  Temp:  98.2 F (36.8 C)  TempSrc: Oral  Resp: 17  Height: 5\' 3"  (1.6 m)  Weight: 134 lb (60.782 kg)  SpO2: 96%    Pulmonary Functions Testing Results: These are completely normal   Assessment & Plan:   1 ASTHMA - montelukast (SINGULAIR) 10 MG tablet; TAKE ONE TABLET BY MOUTH  DAILY AT BEDTIME  Dispense: 30 tablet; Refill: 11 - mometasone-formoterol (DULERA) 200-5 MCG/ACT AERO; INHALE TWO PUFFS INTO LUNGS ONCE DAILY  Dispense: 13 g; Refill: 4 - fluticasone (FLONASE) 50  MCG/ACT nasal spray; USE TWO SPRAY(S) IN EACH NOSTRIL ONCE DAILY  Dispense: 16 g; Refill: 11 - albuterol (PROAIR HFA) 108 (90 BASE) MCG/ACT inhaler; INHALE TWO PUFFS BY MOUTH EVERY 4 HOURS AS NEEDED FOR WHEEZING, COUGH OR SHORTNESS OF BREATH  Dispense: 1 Inhaler; Refill: 11 - pneumococcal 13-valent conjugate vaccine (PREVNAR 13) injection 0.5 mL; Inject 0.5 mLs into the muscle once.  2. Essential hypertension  - amLODipine (NORVASC) 5 MG tablet; One tablet daily  Dispense: 30 tablet; Refill: 11   I personally performed the services described in this documentation, which was scribed in my presence. The recorded information has been reviewed and is accurate.  [name], MD  [facility]  06/02/2015 3:58 PM

## 2015-06-03 DIAGNOSIS — Z23 Encounter for immunization: Secondary | ICD-10-CM

## 2015-06-04 ENCOUNTER — Other Ambulatory Visit: Payer: Self-pay

## 2015-06-04 MED ORDER — NORETHIN ACE-ETH ESTRAD-FE 1-20 MG-MCG(24) PO CHEW: CHEWABLE_TABLET | ORAL | Status: DC

## 2015-06-10 ENCOUNTER — Encounter: Payer: BLUE CROSS/BLUE SHIELD | Admitting: Gynecology

## 2015-06-30 ENCOUNTER — Encounter: Payer: BLUE CROSS/BLUE SHIELD | Admitting: Women's Health

## 2015-07-02 ENCOUNTER — Encounter: Payer: Self-pay | Admitting: Emergency Medicine

## 2015-07-14 ENCOUNTER — Other Ambulatory Visit (HOSPITAL_COMMUNITY)
Admission: RE | Admit: 2015-07-14 | Discharge: 2015-07-14 | Disposition: A | Discharge status: Home / Self Care | Payer: BLUE CROSS/BLUE SHIELD | Source: Ambulatory Visit | Attending: Gynecology | Admitting: Gynecology

## 2015-07-14 ENCOUNTER — Encounter: Payer: Self-pay | Admitting: Women's Health

## 2015-07-14 ENCOUNTER — Ambulatory Visit (INDEPENDENT_AMBULATORY_CARE_PROVIDER_SITE_OTHER): Payer: BLUE CROSS/BLUE SHIELD | Admitting: Women's Health

## 2015-07-14 VITALS — BP 115/74 | Ht 63.0 in | Wt 134.8 lb

## 2015-07-14 DIAGNOSIS — Z01419 Encounter for gynecological examination (general) (routine) without abnormal findings: Secondary | ICD-10-CM | POA: Insufficient documentation

## 2015-07-14 DIAGNOSIS — D259 Leiomyoma of uterus, unspecified: Secondary | ICD-10-CM

## 2015-07-14 DIAGNOSIS — N951 Menopausal and female climacteric states: Secondary | ICD-10-CM

## 2015-07-14 MED ORDER — NORETHIN ACE-ETH ESTRAD-FE 1-20 MG-MCG(24) PO CHEW: CHEWABLE_TABLET | ORAL | Status: DC

## 2015-07-14 NOTE — Patient Instructions (Signed)
Health Recommendations for Postmenopausal Women Respected and ongoing research has looked at the most common causes of death, disability, and poor quality of life in postmenopausal women. The causes include heart disease, diseases of blood vessels, diabetes, depression, cancer, and bone loss (osteoporosis). Many things can be done to help lower the chances of developing these and other common problems. CARDIOVASCULAR DISEASE Heart Disease: A heart attack is a medical emergency. Know the signs and symptoms of a heart attack. Below are things women can do to reduce their risk for heart disease.   Do not smoke. If you smoke, quit.  Aim for a healthy weight. Being overweight causes many preventable deaths. Eat a healthy and balanced diet and drink an adequate amount of liquids.  Get moving. Make a commitment to be more physically active. Aim for 30 minutes of activity on most, if not all days of the week.  Eat for heart health. Choose a diet that is low in saturated fat and cholesterol and eliminate trans fat. Include whole grains, vegetables, and fruits. Read and understand the labels on food containers before buying.  Know your numbers. Ask your caregiver to check your blood pressure, cholesterol (total, HDL, LDL, triglycerides) and blood glucose. Work with your caregiver on improving your entire clinical picture.  High blood pressure. Limit or stop your table salt intake (try salt substitute and food seasonings). Avoid salty foods and drinks. Read labels on food containers before buying. Eating well and exercising can help control high blood pressure. STROKE  Stroke is a medical emergency. Stroke may be the result of a blood clot in a blood vessel in the brain or by a brain hemorrhage (bleeding). Know the signs and symptoms of a stroke. To lower the risk of developing a stroke:  Avoid fatty foods.  Quit smoking.  Control your diabetes, blood pressure, and irregular heart rate. THROMBOPHLEBITIS  (BLOOD CLOT) OF THE LEG  Becoming overweight and leading a stationary lifestyle may also contribute to developing blood clots. Controlling your diet and exercising will help lower the risk of developing blood clots. CANCER SCREENING  Breast Cancer: Take steps to reduce your risk of breast cancer.  You should practice "breast self-awareness." This means understanding the normal appearance and feel of your breasts and should include breast self-examination. Any changes detected, no matter how small, should be reported to your caregiver.  After age 40, you should have a clinical breast exam (CBE) every year.  Starting at age 40, you should consider having a mammogram (breast X-ray) every year.  If you have a family history of breast cancer, talk to your caregiver about genetic screening.  If you are at high risk for breast cancer, talk to your caregiver about having an MRI and a mammogram every year.  Intestinal or Stomach Cancer: Tests to consider are a rectal exam, fecal occult blood, sigmoidoscopy, and colonoscopy. Women who are high risk may need to be screened at an earlier age and more often.  Cervical Cancer:  Beginning at age 30, you should have a Pap test every 3 years as long as the past 3 Pap tests have been normal.  If you have had past treatment for cervical cancer or a condition that could lead to cancer, you need Pap tests and screening for cancer for at least 20 years after your treatment.  If you had a hysterectomy for a problem that was not cancer or a condition that could lead to cancer, then you no longer need Pap tests.    If you are between ages 65 and 70, and you have had normal Pap tests going back 10 years, you no longer need Pap tests.  If Pap tests have been discontinued, risk factors (such as a new sexual partner) need to be reassessed to determine if screening should be resumed.  Some medical problems can increase the chance of getting cervical cancer. In these  cases, your caregiver may recommend more frequent screening and Pap tests.  Uterine Cancer: If you have vaginal bleeding after reaching menopause, you should notify your caregiver.  Ovarian Cancer: Other than yearly pelvic exams, there are no reliable tests available to screen for ovarian cancer at this time except for yearly pelvic exams.  Lung Cancer: Yearly chest X-rays can detect lung cancer and should be done on high risk women, such as cigarette smokers and women with chronic lung disease (emphysema).  Skin Cancer: A complete body skin exam should be done at your yearly examination. Avoid overexposure to the sun and ultraviolet light lamps. Use a strong sun block cream when in the sun. All of these things are important for lowering the risk of skin cancer. MENOPAUSE Menopause Symptoms: Hormone therapy products are effective for treating symptoms associated with menopause:  Moderate to severe hot flashes.  Night sweats.  Mood swings.  Headaches.  Tiredness.  Loss of sex drive.  Insomnia.  Other symptoms. Hormone replacement carries certain risks, especially in older women. Women who use or are thinking about using estrogen or estrogen with progestin treatments should discuss that with their caregiver. Your caregiver will help you understand the benefits and risks. The ideal dose of hormone replacement therapy is not known. The Food and Drug Administration (FDA) has concluded that hormone therapy should be used only at the lowest doses and for the shortest amount of time to reach treatment goals.  OSTEOPOROSIS Protecting Against Bone Loss and Preventing Fracture If you use hormone therapy for prevention of bone loss (osteoporosis), the risks for bone loss must outweigh the risk of the therapy. Ask your caregiver about other medications known to be safe and effective for preventing bone loss and fractures. To guard against bone loss or fractures, the following is recommended:  If  you are younger than age 50, take 1000 mg of calcium and at least 600 mg of Vitamin D per day.  If you are older than age 50 but younger than age 70, take 1200 mg of calcium and at least 600 mg of Vitamin D per day.  If you are older than age 70, take 1200 mg of calcium and at least 800 mg of Vitamin D per day. Smoking and excessive alcohol intake increases the risk of osteoporosis. Eat foods rich in calcium and vitamin D and do weight bearing exercises several times a week as your caregiver suggests. DIABETES Diabetes Mellitus: If you have type I or type 2 diabetes, you should keep your blood sugar under control with diet, exercise, and recommended medication. Avoid starchy and fatty foods, and too many sweets. Being overweight can make diabetes control more difficult. COGNITION AND MEMORY Cognition and Memory: Menopausal hormone therapy is not recommended for the prevention of cognitive disorders such as Alzheimer's disease or memory loss.  DEPRESSION  Depression may occur at any age, but it is common in elderly women. This may be because of physical, medical, social (loneliness), or financial problems and needs. If you are experiencing depression because of medical problems and control of symptoms, talk to your caregiver about this. Physical   activity and exercise may help with mood and sleep. Community and volunteer involvement may improve your sense of value and worth. If you have depression and you feel that the problem is getting worse or becoming severe, talk to your caregiver about which treatment options are best for you. ACCIDENTS  Accidents are common and can be serious in elderly woman. Prepare your house to prevent accidents. Eliminate throw rugs, place hand bars in bath, shower, and toilet areas. Avoid wearing high heeled shoes or walking on wet, snowy, and icy areas. Limit or stop driving if you have vision or hearing problems, or if you feel you are unsteady with your movements and  reflexes. HEPATITIS C Hepatitis C is a type of viral infection affecting the liver. It is spread mainly through contact with blood from an infected person. It can be treated, but if left untreated, it can lead to severe liver damage over the years. Many people who are infected do not know that the virus is in their blood. If you are a "baby-boomer", it is recommended that you have one screening test for Hepatitis C. IMMUNIZATIONS  Several immunizations are important to consider having during your senior years, including:   Tetanus, diphtheria, and pertussis booster shot.  Influenza every year before the flu season begins.  Pneumonia vaccine.  Shingles vaccine.  Others, as indicated based on your specific needs. Talk to your caregiver about these. Document Released: 01/05/2006 Document Revised: 03/30/2014 Document Reviewed: 08/31/2008 ExitCare Patient Information 2015 [employer]. This information is not intended to replace advice given to you by your health care provider. Make sure you discuss any questions you have with your health care provider.  

## 2015-07-14 NOTE — Progress Notes (Signed)
Tiffany Cline 02-Oct-1969 [id number]    History:    Presents for annual exam.  Light monthly cycle on Loestrin 1/20. Normal Pap and mammogram history. Has not had a screening colonoscopy. 2001 myomectomy, last ultrasound 2012 had approximately 10  2 cm fibroids.  Past medical history, past surgical history, family history and social history were all reviewed and documented in the EPIC chart. 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  ROS:  A ROS was performed and pertinent positives and negatives are included.  Exam:  Filed Vitals:   07/14/15 1512  BP: 115/74    General appearance:  Normal Thyroid:  Symmetrical, normal in size, without palpable masses or nodularity. Respiratory  Auscultation:  Clear without wheezing or rhonchi Cardiovascular  Auscultation:  Regular rate, without rubs, murmurs or gallops  Edema/varicosities:  Not grossly evident Abdominal  Soft,nontender, without masses, guarding or rebound.  Liver/spleen:  No organomegaly noted  Hernia:  None appreciated  Skin  Inspection:  Grossly normal   Breasts: Examined lying and sitting.     Right: Without masses, retractions, discharge or axillary adenopathy.     Left: Nipple inverted/always Without masses, retractions, discharge or axillary adenopathy. Gentitourinary   Inguinal/mons:  Normal without inguinal adenopathy  External genitalia:  Normal  BUS/Urethra/Skene's glands:  Normal  Vagina:  Normal  Cervix:  Normal  Uterus:  Enlarged shifted to the left bulky  Adnexa/parametria:     Rt: Without masses or tenderness.   Lt: Without masses or tenderness.  Anus and perineum: Normal  Digital rectal exam: Normal sphincter tone without palpated masses or tenderness  Assessment/Plan:  54 y.o. SBF G0 for annual exam with increased pelvic pressure  Light monthly cycle Loestrin Fibroid uterus Hypertension/asthma-primary care manages labs and meds  Plan:  Loestrin 1/20 prescription, proper use given and reviewed slight risk for blood clots and strokes. SBE's, continue annual 3-D screening mammogram history of dense breasts. Exercise, calcium rich diet, vitamin D 2000 daily encouraged. FSH, UA, Pap normal with negative HR HPV with  inflammation, repeat Pap only today. Schedule ultrasound with Dr. Phineas Real to discuss possible treatment.  [name] [facility] ([facility]), 3:58 PM 07/14/2015

## 2015-07-15 ENCOUNTER — Encounter: Payer: Self-pay | Admitting: Women's Health

## 2015-07-15 LAB — FOLLICLE STIMULATING HORMONE: FSH: 2.3 m[IU]/mL

## 2015-07-19 LAB — CYTOLOGY - PAP

## 2015-07-28 ENCOUNTER — Telehealth: Payer: Self-pay

## 2015-07-28 NOTE — Telephone Encounter (Signed)
Patient is calling to get her hep B record faxed to [facility] on [address]. Patient phone: [phone]

## 2015-07-28 NOTE — Telephone Encounter (Signed)
Can we fax without a release?

## 2015-07-30 NOTE — Telephone Encounter (Signed)
Faxed to [facility] at [phone].

## 2015-08-16 ENCOUNTER — Ambulatory Visit (INDEPENDENT_AMBULATORY_CARE_PROVIDER_SITE_OTHER): Payer: BLUE CROSS/BLUE SHIELD | Admitting: Family Medicine

## 2015-08-16 VITALS — BP 122/80 | HR 71 | Temp 98.6°F | Resp 20 | Ht 63.0 in | Wt 132.5 lb

## 2015-08-16 DIAGNOSIS — J0100 Acute maxillary sinusitis, unspecified: Secondary | ICD-10-CM

## 2015-08-16 DIAGNOSIS — R51 Headache: Secondary | ICD-10-CM | POA: Diagnosis not present

## 2015-08-16 DIAGNOSIS — R519 Headache, unspecified: Secondary | ICD-10-CM

## 2015-08-16 MED ORDER — AMOXICILLIN-POT CLAVULANATE 875-125 MG PO TABS: 1.0000 | ORAL_TABLET | Freq: Two times a day (BID) | ORAL | Status: DC

## 2015-08-16 NOTE — Patient Instructions (Signed)
Continue your baseline current medications  Use Sudafed intermittently as needed for congestion    Take Augmentin 875 one twice daily    Return if not improving and further diagnostic testing might be needed.   If any indication of a rash come in right away.

## 2015-08-16 NOTE — Progress Notes (Signed)
Patient ID: Tiffany Cline, female    DOB: [birth year]  Age: 54 y.o. MRN: [id number]  Chief Complaint  Patient presents with  . Headache    Left Side since Saturday  . Dizziness    mostly in am  . Facial Pain    Left side    Subjective:    54 year old lady who has began not feeling well for the last couple of days. She had a left-sided headache in the forehead and cheek area. She Little dizzy, mostly in the morning. She has had the persistent facial pain that comes and goes. No rashes develop. No fever. She has a history of migraines but this is distinctly different from her migraines. She has had no photophobia. She does not smoke. 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 She has taken pseudoephedrine for decongestion, but avoids taking too much of it due to her blood pressure. Her blood pressure was good today. She is on various allergy medications. See the list.  Current allergies, medications, problem list, past/family and social histories reviewed.  Objective:  BP 122/80 mmHg  Pulse 71  Temp(Src) 98.6 F (37 C) (Oral)  Resp 20  Ht 5\' 3"  (1.6 m)  Wt 132 lb 8 oz (60.102 kg)  BMI 23.48 kg/m2  SpO2 99%  LMP 08/06/2015   no acute distress. Her TMs are normal. Eyes PERRLA. Mild tenderness over the maxillary and frontal sinus on the left. Throat clear. Teeth look good. Neck supple without significant nodes. Chest is clear to auscultation. Heart regular without murmur.  Assessment & Plan:   Assessment: 1. Left facial pain   2. Acute maxillary sinusitis, recurrence not specified       Plan:  Meds ordered this encounter  Medications  . amoxicillin-clavulanate (AUGMENTIN) 875-125 MG per tablet    Sig: Take 1 tablet by mouth 2 (two) times daily.    Dispense:  20 tablet    Refill:  0      talk to her about differential. I'm not certain what she has. We will go ahead and treat sinusitis, but it could be some kind of a neuralgia. Cautioned her to let us know if  there is any rash develop.    Patient Instructions   Continue your baseline current medications  Use Sudafed intermittently as needed for congestion    Take Augmentin 875 one twice daily    Return if not improving and further diagnostic testing might be needed.   If any indication of a rash come in right away.      Return if symptoms worsen or fail to improve.   Alysse Rathe, MD 08/16/2015

## 2015-08-24 ENCOUNTER — Other Ambulatory Visit: Payer: Self-pay

## 2015-08-24 MED ORDER — FLUCONAZOLE 150 MG PO TABS: 150.0000 mg | ORAL_TABLET | Freq: Once | ORAL | Status: DC

## 2015-08-25 ENCOUNTER — Other Ambulatory Visit: Payer: Self-pay | Admitting: Women's Health

## 2015-08-25 ENCOUNTER — Ambulatory Visit (INDEPENDENT_AMBULATORY_CARE_PROVIDER_SITE_OTHER): Payer: BLUE CROSS/BLUE SHIELD

## 2015-08-25 ENCOUNTER — Encounter: Payer: Self-pay | Admitting: Women's Health

## 2015-08-25 ENCOUNTER — Ambulatory Visit (INDEPENDENT_AMBULATORY_CARE_PROVIDER_SITE_OTHER): Payer: BLUE CROSS/BLUE SHIELD | Admitting: Women's Health

## 2015-08-25 VITALS — BP 122/80 | Ht 63.0 in | Wt 132.0 lb

## 2015-08-25 DIAGNOSIS — N852 Hypertrophy of uterus: Secondary | ICD-10-CM

## 2015-08-25 DIAGNOSIS — D251 Intramural leiomyoma of uterus: Secondary | ICD-10-CM

## 2015-08-25 DIAGNOSIS — N858 Other specified noninflammatory disorders of uterus: Secondary | ICD-10-CM | POA: Diagnosis not present

## 2015-08-25 DIAGNOSIS — D259 Leiomyoma of uterus, unspecified: Secondary | ICD-10-CM | POA: Diagnosis not present

## 2015-08-25 NOTE — Progress Notes (Signed)
Patient ID: Tiffany Cline, female   DOB: [birth year], 54 y.o.   MRN: [id number] Presents for ultrasound. History of fibroids. Continues to have monthly cycle with minimal menopausal symptoms on OCs. 2014 had multiple 2 cm fibroids.  Exam: Appears well. General than stated years. Ultrasound: T/V retroverted uterine fibroids intramural and subserous 33 x 28 mm, 27 x 20 mm, 21 x 15 mm, 17 x 15 mm, 23 x 13 mm, 13 x 11 mm, 20 x 19 mm, 33 x 29 mm. Fluid-filled endometrium 19 x 4 mm. Endometrium 3.1 mm. Right and left ovary normal. Negative cul-de-sac.  Multiple fibroid uterus.  Plan: Reviewed fluid-filled endometrium will watch at this time ultrasound reviewed with Dr. Phineas Real. We'll continue OCs, check FSH at annual exam placebo week.

## 2015-08-31 ENCOUNTER — Encounter: Payer: Self-pay | Admitting: Emergency Medicine

## 2015-12-21 ENCOUNTER — Telehealth: Payer: Self-pay

## 2015-12-21 MED ORDER — NORETHIN ACE-ETH ESTRAD-FE 1-20 MG-MCG(24) PO CAPS: 1.0000 ug | ORAL_CAPSULE | Freq: Every day | ORAL | Status: DC

## 2015-12-21 NOTE — Telephone Encounter (Signed)
Pharmacy sent fax note requesting "Patient has 10 packs left on this Rx (Minastrin) but her insurance requests Taytulla-28 for 2017. Ok to Change? This paper message was sent to [place] and she responded "ok for change". Rx sent.

## 2016-01-04 ENCOUNTER — Ambulatory Visit (INDEPENDENT_AMBULATORY_CARE_PROVIDER_SITE_OTHER): Payer: BLUE CROSS/BLUE SHIELD | Admitting: Family Medicine

## 2016-01-04 VITALS — BP 132/80 | HR 83 | Temp 99.3°F | Resp 20 | Ht 63.5 in | Wt 135.2 lb

## 2016-01-04 DIAGNOSIS — S90221A Contusion of right lesser toe(s) with damage to nail, initial encounter: Secondary | ICD-10-CM | POA: Diagnosis not present

## 2016-01-04 NOTE — Progress Notes (Signed)
Patient ID: Tiffany Cline, female    DOB: 1961/02/03  Age: 55 y.o. MRN: [id number]  Chief Complaint  Patient presents with  . 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 She has lately noticed a pigmented straighten her right second toe. Knows of no trauma. No change in medicines. No other illnesses. She has some mild asthma she uses medication for, but nothing is been changed lately. Her menstrual cycle is timely, just recently. She is not pregnant.  Current allergies, medications, problem list, past/family and social histories reviewed.  Objective:  BP 132/80 mmHg  Pulse 83  Temp(Src) 99.3 F (37.4 C) (Oral)  Resp 20  Ht 5' 3.5" (1.613 m)  Wt 135 lb 3.2 oz (61.326 kg)  BMI 23.57 kg/m2  SpO2 98%  LMP 12/24/2015  Darkly pigmented band in the tissue of the nail longitudinally in the right second toenail about 2 mm in width. The pigment can be seen into the nail at the diabetes base of the cuticle, but there is no melanotic skin lesion there.  Assessment & Plan:   Assessment: 1. Toenail bruise, right, initial encounter      Plan: It is not actually a toenail bruise, but I cannot get the computer to give me a diagnosis code for longitudinal melonychia  Orders Placed This Encounter  Procedures  . Ambulatory referral to Dermatology    Referral Priority:  Routine    Referral Type:  Consultation    Referral Reason:  Specialty Services Required    Referred to Provider:  Crista Luria, MD    Requested Specialty:  Dermatology    Number of Visits Requested:  1    No orders of the defined types were placed in this encounter.         Patient Instructions  Referral is being made to dermatology. In the meanwhile I would not worry about it.  Call back if you don't hear from that referral in the next week     Return if symptoms worsen or fail to improve.   Tiffany Cavell, MD 01/04/2016

## 2016-01-04 NOTE — Patient Instructions (Signed)
Referral is being made to dermatology. In the meanwhile I would not worry about it.  Call back if you don't hear from that referral in the next week

## 2016-02-19 ENCOUNTER — Ambulatory Visit (INDEPENDENT_AMBULATORY_CARE_PROVIDER_SITE_OTHER): Payer: BLUE CROSS/BLUE SHIELD | Admitting: Family Medicine

## 2016-02-19 VITALS — BP 118/82 | HR 90 | Temp 98.3°F | Resp 18 | Ht 63.5 in | Wt 136.4 lb

## 2016-02-19 DIAGNOSIS — J0110 Acute frontal sinusitis, unspecified: Secondary | ICD-10-CM | POA: Diagnosis not present

## 2016-02-19 MED ORDER — AMOXICILLIN-POT CLAVULANATE 875-125 MG PO TABS
1.0000 | ORAL_TABLET | Freq: Two times a day (BID) | ORAL | Status: DC
Start: 2016-02-19 — End: 2016-04-12

## 2016-02-19 NOTE — Progress Notes (Signed)
Subjective:    Patient ID: Tiffany Cline, female    DOB: January 15, 1961, 55 y.o.   MRN: [id number] By signing my name below, I, Tiffany Cline, attest that this documentation has been prepared under the direction and in the presence of [name], MD. Electronically Signed: Soijett Cline, ED Scribe. 02/19/2016. 3:36 PM.  Chief Complaint  Patient presents with   Allergies    x1 week   Asthma    x1 week, chest feels tight    HPI  Tiffany Cline is a 55 y.o. female with a medical hx of asthma and leiomyoma who presents to the Emergency Department complaining of sinus pressure onset 1 week.  Pt reports that she is going on vacation in [place] and she is leaving the end of the week. Pt notes that she has used Singulair and neb treatments for her asthma. Pt is having associated symptoms of throbbing intermittent HA x 2 days. She notes that she has not tried any medications for the relief of her symptoms. She denies fever, chills, color change, rash, wound, and any other symptoms.   Pt is a Software engineer.   Past Medical History  Diagnosis Date   Leiomyoma    Asthma     induced by cold and allergies   Allergies  Allergen Reactions   Septra [Sulfamethoxazole-Trimethoprim] Shortness Of Breath and Rash   Latex Itching and Rash    Itching and redness with rash with use of latex gloves when she had blood drawn   Sulfa Antibiotics Rash   Current Outpatient Prescriptions on File Prior to Visit  Medication Sig Dispense Refill   albuterol (PROAIR HFA) 108 (90 BASE) MCG/ACT inhaler INHALE TWO PUFFS BY MOUTH EVERY 4 HOURS AS NEEDED FOR WHEEZING, COUGH OR SHORTNESS OF BREATH 1 Inhaler 11   amLODipine (NORVASC) 5 MG tablet One tablet daily 30 tablet 11   cyclobenzaprine (FLEXERIL) 10 MG tablet TAKE ONE TABLET BY MOUTH THREE TIMES DAILY AS NEEDED FOR MUSCLE SPASMS 30 tablet 4   fluticasone (FLONASE) 50 MCG/ACT nasal spray USE TWO SPRAY(S) IN EACH NOSTRIL ONCE DAILY 16 g 11   GuaiFENesin (MUCINEX  PO) Take by mouth.       mometasone-formoterol (DULERA) 200-5 MCG/ACT AERO INHALE TWO PUFFS INTO LUNGS ONCE DAILY 13 g 4   montelukast (SINGULAIR) 10 MG tablet TAKE ONE TABLET BY MOUTH  DAILY AT BEDTIME 30 tablet 11   Norethin Ace-Eth Estrad-FE (TAYTULLA) 1-20 MG-MCG(24) CAPS Take 1-20 mcg by mouth daily. 28 capsule 10   UNABLE TO FIND Nebulizer machine. Dx code J45.31 1 Device 0   amoxicillin-clavulanate (AUGMENTIN) 875-125 MG per tablet Take 1 tablet by mouth 2 (two) times daily. (Patient not taking: Reported on 01/04/2016) 20 tablet 0   Azelastine HCl (ASTEPRO) 0.15 % SOLN USE ONE TO TWO SPRAY IN EACH NOSTRIL TWICE DAILY (Patient not taking: Reported on 01/04/2016) 30 mL 11   Current Facility-Administered Medications on File Prior to Visit  Medication Dose Route Frequency Provider Last Rate Last Dose   ipratropium (ATROVENT) nebulizer solution 0.5 mg  0.5 mg Nebulization Once [employer], PA-C        . Review of Systems  Constitutional: Negative for fever and chills.  HENT: Positive for rhinorrhea and sinus pressure.   Skin: Negative for color change, rash and wound.  Neurological: Positive for headaches.  All other systems reviewed and are negative.      Objective:   Physical Exam  Constitutional: She is oriented to person, place, and time.  She appears well-developed and well-nourished. No distress.  HENT:  Head: Normocephalic and atraumatic.  Nuchal purulent discharge.   Eyes: EOM are normal.  Neck: Neck supple.  Cardiovascular: Normal rate, regular rhythm and normal heart sounds.   No murmur heard. Pulmonary/Chest: Effort normal and breath sounds normal. No respiratory distress. She has no wheezes. She has no rales.  Musculoskeletal: Normal range of motion.  Neurological: She is alert and oriented to person, place, and time.  Skin: Skin is warm and dry.  Psychiatric: She has a normal mood and affect. Her behavior is normal.  Nursing note and vitals reviewed.         BP 118/82 mmHg   Pulse 90   Temp(Src) 98.3 F (36.8 C) (Oral)   Resp 18   Ht 5' 3.5" (1.613 m)   Wt 136 lb 6.4 oz (61.871 kg)   BMI 23.78 kg/m2   SpO2 97%   LMP 02/18/2016  Assessment & Plan:   This chart was scribed in my presence and reviewed by me personally.    ICD-9-CM ICD-10-CM   1. Acute frontal sinusitis, recurrence not specified 461.1 J01.10 amoxicillin-clavulanate (AUGMENTIN) 875-125 MG tablet     Signed, [name], MD

## 2016-02-19 NOTE — Patient Instructions (Signed)

## 2016-02-22 ENCOUNTER — Ambulatory Visit (INDEPENDENT_AMBULATORY_CARE_PROVIDER_SITE_OTHER): Payer: BLUE CROSS/BLUE SHIELD | Admitting: Physician Assistant

## 2016-02-22 VITALS — BP 130/78 | HR 88 | Temp 98.8°F | Resp 20 | Ht 63.5 in | Wt 136.6 lb

## 2016-02-22 DIAGNOSIS — R05 Cough: Secondary | ICD-10-CM | POA: Diagnosis not present

## 2016-02-22 DIAGNOSIS — R059 Cough, unspecified: Secondary | ICD-10-CM

## 2016-02-22 DIAGNOSIS — Z8709 Personal history of other diseases of the respiratory system: Secondary | ICD-10-CM | POA: Diagnosis not present

## 2016-02-22 MED ORDER — ALBUTEROL SULFATE (2.5 MG/3ML) 0.083% IN NEBU
2.5000 mg | INHALATION_SOLUTION | Freq: Once | RESPIRATORY_TRACT | Status: AC
  Administered 2016-02-22: 2.5 mg via RESPIRATORY_TRACT

## 2016-02-22 MED ORDER — IPRATROPIUM BROMIDE 0.02 % IN SOLN
0.5000 mg | Freq: Once | RESPIRATORY_TRACT | Status: AC
  Administered 2016-02-22: 0.5 mg via RESPIRATORY_TRACT

## 2016-02-22 MED ORDER — BENZONATATE 200 MG PO CAPS: 200.0000 mg | ORAL_CAPSULE | Freq: Two times a day (BID) | ORAL | Status: DC | PRN

## 2016-02-22 MED ORDER — PREDNISONE 20 MG PO TABS: ORAL_TABLET | ORAL | Status: AC

## 2016-02-22 NOTE — Progress Notes (Signed)
02/22/2016 3:51 PM   DOB: November 19, 1961 / MRN: [id number]  SUBJECTIVE:  Tiffany Cline is a 55 y.o. female presenting for chest tightness and SOB.  She has a history of asthma and reports that it flares every time she gets a cold.  She was here three days ago and saw Dr. Joseph Art with the chief complaint of allergies and asthma and was diagnosed with frontal sinusitis, and was subsequently prescribed Augmentin, of which she is currently taking.   Today she reports she is worse. Her chest is tighter and she continues to have cough, nasal congestion, and HA.  She has tried pseudoephedrine 12 hour without relief.    She is allergic to septra; latex; and sulfa antibiotics.   She  has a past medical history of Leiomyoma and Asthma.    She  reports that she has never smoked. She has never used smokeless tobacco. She reports that she does not drink alcohol or use illicit drugs. She  reports that she currently engages in sexual activity. She reports using the following method of birth control/protection: Pill. The patient  has past surgical history that includes Myomectomy (2001).  Her family history includes Asthma in her paternal grandfather; Cancer in her father; Diabetes in her mother; Heart disease in her maternal grandmother; Hypertension in her father, mother, and paternal grandmother; Thyroid disease in her father.  Review of Systems  Constitutional: Positive for malaise/fatigue. Negative for fever, chills and diaphoresis.  HENT: Positive for congestion and sore throat.   Respiratory: Positive for cough. Negative for hemoptysis, shortness of breath and wheezing.   Cardiovascular: Negative for chest pain.  Gastrointestinal: Negative for nausea.  Skin: Negative for rash.  Neurological: Negative for dizziness and weakness.  Endo/Heme/Allergies: Negative for polydipsia.    Problem list and medications reviewed and updated by myself where necessary, and exist elsewhere in the encounter.    OBJECTIVE:  BP 130/78 mmHg  Pulse 88  Temp(Src) 98.8 F (37.1 C) (Oral)  Resp 20  Ht 5' 3.5" (1.613 m)  Wt 136 lb 9.6 oz (61.961 kg)  BMI 23.81 kg/m2  SpO2 98%  LMP 02/18/2016  Physical Exam  Constitutional: She is oriented to person, place, and time. She appears well-nourished. No distress.  Eyes: EOM are normal. Pupils are equal, round, and reactive to light.  Cardiovascular: Normal rate.   Pulmonary/Chest: Effort normal.  Abdominal: She exhibits no distension.  Neurological: She is alert and oriented to person, place, and time. No cranial nerve deficit. Gait normal.  Skin: Skin is dry. She is not diaphoretic.  Psychiatric: She has a normal mood and affect.  Vitals reviewed.   No results found for this or any previous visit (from the past 72 hour(s)).  No results found.   ASSESSMENT AND PLAN  Tiffany Cline was seen today for follow-up.  Diagnoses and all orders for this visit:  Cough: I hear no wheezing today.  Given problem 2 will cover with a steroid.   -     albuterol (PROVENTIL) (2.5 MG/3ML) 0.083% nebulizer solution 2.5 mg; Take 3 mLs (2.5 mg total) by nebulization once. -     ipratropium (ATROVENT) nebulizer solution 0.5 mg; Take 2.5 mLs (0.5 mg total) by nebulization once. -     benzonatate (TESSALON) 200 MG capsule; Take 1 capsule (200 mg total) by mouth 2 (two) times daily as needed for cough.  History of asthma -     predniSONE (DELTASONE) 20 MG tablet; Take 3 in the morning for 3 days, then  2 in the morning for 3 days, and then 1 in the morning for 3 days.    The patient was advised to call or return to clinic if she does not see an improvement in symptoms or to seek the care of the closest emergency department if she worsens with the above plan.   Tiffany Cline, MHS, PA-C [facility] 02/22/2016 3:51 PM

## 2016-02-22 NOTE — Patient Instructions (Signed)
     IF you received an x-ray today, you will receive an invoice from [facility]. Please contact [facility] at [phone] with questions or concerns regarding your invoice.   IF you received labwork today, you will receive an invoice from [facility]. Please contact [facility] at [phone] with questions or concerns regarding your invoice.   Our billing staff will not be able to assist you with questions regarding bills from these companies.  You will be contacted with the lab results as soon as they are available. The fastest way to get your results is to activate your My Chart account. Instructions are located on the last page of this paperwork. If you have not heard from us regarding the results in 2 weeks, please contact this office.      

## 2016-02-28 ENCOUNTER — Other Ambulatory Visit: Payer: Self-pay | Admitting: Emergency Medicine

## 2016-04-04 ENCOUNTER — Other Ambulatory Visit: Payer: Self-pay | Admitting: Physician Assistant

## 2016-04-12 ENCOUNTER — Ambulatory Visit (INDEPENDENT_AMBULATORY_CARE_PROVIDER_SITE_OTHER): Payer: BLUE CROSS/BLUE SHIELD | Admitting: Emergency Medicine

## 2016-04-12 ENCOUNTER — Ambulatory Visit (INDEPENDENT_AMBULATORY_CARE_PROVIDER_SITE_OTHER): Payer: BLUE CROSS/BLUE SHIELD

## 2016-04-12 VITALS — BP 132/80 | HR 90 | Temp 98.8°F | Resp 18 | Ht 63.5 in | Wt 138.0 lb

## 2016-04-12 DIAGNOSIS — Z8709 Personal history of other diseases of the respiratory system: Secondary | ICD-10-CM

## 2016-04-12 DIAGNOSIS — J4541 Moderate persistent asthma with (acute) exacerbation: Secondary | ICD-10-CM

## 2016-04-12 DIAGNOSIS — Z1159 Encounter for screening for other viral diseases: Secondary | ICD-10-CM | POA: Diagnosis not present

## 2016-04-12 DIAGNOSIS — Z13220 Encounter for screening for lipoid disorders: Secondary | ICD-10-CM | POA: Diagnosis not present

## 2016-04-12 DIAGNOSIS — R059 Cough, unspecified: Secondary | ICD-10-CM

## 2016-04-12 DIAGNOSIS — I10 Essential (primary) hypertension: Secondary | ICD-10-CM | POA: Diagnosis not present

## 2016-04-12 DIAGNOSIS — R9389 Abnormal findings on diagnostic imaging of other specified body structures: Secondary | ICD-10-CM

## 2016-04-12 DIAGNOSIS — Z131 Encounter for screening for diabetes mellitus: Secondary | ICD-10-CM

## 2016-04-12 DIAGNOSIS — R938 Abnormal findings on diagnostic imaging of other specified body structures: Secondary | ICD-10-CM

## 2016-04-12 DIAGNOSIS — R05 Cough: Secondary | ICD-10-CM

## 2016-04-12 LAB — POCT CBC
Granulocyte percent: 75.3 %G (ref 37–80)
HCT, POC: 40.8 % (ref 37.7–47.9)
Hemoglobin: 14 g/dL (ref 12.2–16.2)
Lymph, poc: 1.7 (ref 0.6–3.4)
MCH, POC: 29.4 pg (ref 27–31.2)
MCHC: 34.2 g/dL (ref 31.8–35.4)
MCV: 85.8 fL (ref 80–97)
MID (cbc): 0.3 (ref 0–0.9)
MPV: 8.1 fL (ref 0–99.8)
POC Granulocyte: 6.2 (ref 2–6.9)
POC LYMPH PERCENT: 21 %L (ref 10–50)
POC MID %: 3.7 %M (ref 0–12)
Platelet Count, POC: 225 10*3/uL (ref 142–424)
RBC: 4.75 M/uL (ref 4.04–5.48)
RDW, POC: 14.7 %
WBC: 8.2 10*3/uL (ref 4.6–10.2)

## 2016-04-12 LAB — BASIC METABOLIC PANEL WITH GFR
BUN: 7 mg/dL (ref 7–25)
CO2: 25 mmol/L (ref 20–31)
Calcium: 8.4 mg/dL — ABNORMAL LOW (ref 8.6–10.4)
Chloride: 102 mmol/L (ref 98–110)
Creat: 0.76 mg/dL (ref 0.50–1.05)
GFR, Est African American: >89 mL/min (ref >=60)
GFR, Est Non African American: >89 mL/min (ref >=60)
Glucose, Bld: 84 mg/dL (ref 65–99)
Potassium: 3.7 mmol/L (ref 3.5–5.3)
Sodium: 139 mmol/L (ref 135–146)

## 2016-04-12 LAB — LIPID PANEL
Cholesterol: 207 mg/dL — ABNORMAL HIGH (ref 125–200)
HDL: 71 mg/dL (ref >=46)
LDL Cholesterol: 124 mg/dL (ref <130)
Total CHOL/HDL Ratio: 2.9 Ratio (ref <=5.0)
Triglycerides: 60 mg/dL (ref <150)
VLDL: 12 mg/dL (ref <30)

## 2016-04-12 LAB — GLUCOSE, POCT (MANUAL RESULT ENTRY): POC Glucose: 90 mg/dl (ref 70–99)

## 2016-04-12 MED ORDER — MONTELUKAST SODIUM 10 MG PO TABS: ORAL_TABLET | ORAL | Status: DC

## 2016-04-12 MED ORDER — FLUTICASONE PROPIONATE 50 MCG/ACT NA SUSP: NASAL | Status: AC

## 2016-04-12 MED ORDER — AMLODIPINE BESYLATE 5 MG PO TABS: ORAL_TABLET | ORAL | Status: DC

## 2016-04-12 MED ORDER — MOMETASONE FURO-FORMOTEROL FUM 200-5 MCG/ACT IN AERO: INHALATION_SPRAY | RESPIRATORY_TRACT | Status: DC

## 2016-04-12 MED ORDER — ALBUTEROL SULFATE HFA 108 (90 BASE) MCG/ACT IN AERS: INHALATION_SPRAY | RESPIRATORY_TRACT | Status: DC

## 2016-04-12 NOTE — Patient Instructions (Signed)
     IF you received an x-ray today, you will receive an invoice from [facility]. Please contact [facility] at [phone] with questions or concerns regarding your invoice.   IF you received labwork today, you will receive an invoice from [facility]. Please contact [facility] at [phone] with questions or concerns regarding your invoice.   Our billing staff will not be able to assist you with questions regarding bills from these companies.  You will be contacted with the lab results as soon as they are available. The fastest way to get your results is to activate your My Chart account. Instructions are located on the last page of this paperwork. If you have not heard from us regarding the results in 2 weeks, please contact this office.      

## 2016-04-12 NOTE — Progress Notes (Addendum)
By signing my name below, I, Tiffany Cline, attest that this documentation has been prepared under the direction and in the presence of Tiffany Queen, MD. Electronically Signed: Moises Cline, [place]. 04/12/2016 , 10:43 AM .  Patient was seen in room 4 .  Chief Complaint:  Chief Complaint  Patient presents with  . Asthma  . Hypertension    HPI: Tiffany Cline is a 55 y.o. female who [facility] to [facility] today for asthma and HTN. She's been raspy for about 1.5 month. She went to [place] last month for vacation. She was given augmentin and prednisone prior to [place]. She usually has relief after taking prednisone, but didn't find relief this time. She believes slight flare due to seasonal allergies. She denies any new changes at home.   She works as a Software engineer.   Past Medical History  Diagnosis Date  . Leiomyoma   . Asthma     induced by cold and allergies   Past Surgical History  Procedure Laterality Date  . Myomectomy  2001   Social History   Social History  . Marital Status: Single    Spouse Name: [name]  . Number of Children: [name]  . Years of Education: [name]   Social History Main Topics  . Smoking status: Never Smoker   . Smokeless tobacco: Never Used  . Alcohol Use: No  . Drug Use: No  . Sexual Activity: Yes    Birth Control/ Protection: Pill   Other Topics Concern  . None   Social History Narrative   Family History  Problem Relation Age of Onset  . Diabetes Mother   . Hypertension Mother   . Hypertension Father   . Cancer Father     prostate  . Thyroid disease Father     benign tumor  . Heart disease Maternal Grandmother   . Hypertension Paternal Grandmother   . Asthma Paternal Grandfather    Allergies  Allergen Reactions  . Septra [Sulfamethoxazole-Trimethoprim] Shortness Of Breath and Rash  . Latex Itching and Rash    Itching and redness with rash with use of latex gloves when she had Cline drawn  . Sulfa Antibiotics Rash   Prior to Admission  medications   Medication Sig Start Date End Date Taking? Authorizing Provider  albuterol (PROAIR HFA) 108 (90 BASE) MCG/ACT inhaler INHALE TWO PUFFS BY MOUTH EVERY 4 HOURS AS NEEDED FOR WHEEZING, COUGH OR SHORTNESS OF BREATH 06/02/15   [name], MD  amLODipine (NORVASC) 5 MG tablet One tablet daily 06/02/15   [name], MD  amoxicillin-clavulanate (AUGMENTIN) 875-125 MG tablet Take 1 tablet by mouth 2 (two) times daily. 02/19/16   [name], MD  benzonatate (TESSALON) 200 MG capsule Take 1 capsule (200 mg total) by mouth 2 (two) times daily as needed for cough. 02/22/16   [name], PA-C  cyclobenzaprine (FLEXERIL) 10 MG tablet TAKE ONE TABLET BY MOUTH THREE TIMES DAILY AS NEEDED FOR MUSCLE SPASMS 01/20/15   [name], PA-C  fluticasone [facility]) 50 MCG/ACT nasal spray USE TWO SPRAY(S) IN EACH NOSTRIL ONCE DAILY 06/02/15   [name], MD  GuaiFENesin (MUCINEX PO) Take by mouth.      Historical Provider, MD  mometasone-formoterol (DULERA) 200-5 MCG/ACT AERO Inhale 2 puffs into the lungs daily. 04/06/16   [name], PA-C  montelukast (SINGULAIR) 10 MG tablet TAKE ONE TABLET BY MOUTH  DAILY AT BEDTIME 06/02/15   [name], MD  Norethin Ace-Eth Estrad-FE (TAYTULLA) 1-20  MG-MCG(24) CAPS Take 1-20 mcg by mouth daily. 12/21/15   [name], NP  UNABLE TO FIND Nebulizer machine. Dx code [id number] 10/29/14   [name], MD     ROS:  Constitutional: negative for fever, chills, night sweats, weight changes, or fatigue  HEENT: negative for vision changes, hearing loss, congestion, rhinorrhea, ST, epistaxis, or sinus pressure Cardiovascular: negative for chest pain or palpitations Respiratory: negative for hemoptysis, wheezing, shortness of breath or cough Abdominal: negative for abdominal pain, nausea, vomiting, diarrhea, or constipation Dermatological: negative for rash Neurologic: negative for headache, dizziness, or syncope All other systems reviewed and are otherwise  negative with the exception to those above and in the HPI.  PHYSICAL EXAM: Filed Vitals:   04/12/16 0944  BP: 132/80  Pulse: 90  Temp: 98.8 F (37.1 C)  Resp: 18   Body mass index is 24.06 kg/(m^2).   General: Alert, no acute distress HEENT:  Normocephalic, atraumatic, oropharynx patent. Eye: [name] [facility] Cardiovascular:  Regular rate and rhythm, no rubs murmurs or gallops.  No Carotid bruits, radial pulse intact. No pedal edema.  Respiratory: Clear to auscultation bilaterally.  No wheezes, rales, or rhonchi.  No cyanosis, no use of accessory musculature Abdominal: No organomegaly, abdomen is soft and non-tender, positive bowel sounds. No masses. Musculoskeletal: Gait intact. No edema, tenderness Skin: No rashes. Neurologic: Facial musculature symmetric. Psychiatric: Patient acts appropriately throughout our interaction.  Lymphatic: No cervical or submandibular lymphadenopathy Genitourinary/Anorectal: No acute findings  LABS: Results for orders placed or performed in visit on 04/12/16  POCT CBC  Result Value Ref Range   WBC 8.2 4.6 - 10.2 K/uL   Lymph, poc 1.7 0.6 - 3.4   POC LYMPH PERCENT 21.0 10 - 50 %L   MID (cbc) 0.3 0 - 0.9   POC MID % 3.7 0 - 12 %M   POC Granulocyte 6.2 2 - 6.9   Granulocyte percent 75.3 37 - 80 %G   RBC 4.75 4.04 - 5.48 M/uL   Hemoglobin 14.0 12.2 - 16.2 g/dL   HCT, POC 40.8 37.7 - 47.9 %   MCV 85.8 80 - 97 fL   MCH, POC 29.4 27 - 31.2 pg   MCHC 34.2 31.8 - 35.4 g/dL   RDW, POC 14.7 %   Platelet Count, POC 225 142 - 424 K/uL   MPV 8.1 0 - 99.8 fL  POCT glucose (manual entry)  Result Value Ref Range   POC Glucose 90 70 - 99 mg/dl   PFT Normal  EKG/XRAY:   Dg Chest 2 View  04/12/2016  CLINICAL DATA:  Cough. EXAM: CHEST  2 VIEW COMPARISON:  12/23/2013. FINDINGS: Mediastinum and hilar structures normal. Lungs are clear. Heart size normal. No pleural effusion or pneumothorax. Mild right apical pleural thickening noted consistent with  scarring. IMPRESSION: No acute cardiopulmonary disease. Mild right apical pleural thickening noted consistent with scarring. Electronically Signed   By: Marcello Moores  Register   On: 04/12/2016 11:09    ASSESSMENT/PLAN: Chest x-ray shows some right apical scarring. Because she is felt persistently ill will proceed with pulmonary evaluation. She will add Zantac 150 mg at night. Referral to pulmonary done.I personally performed the services described in this documentation, which was scribed in my presence. The recorded information has been reviewed and is accurate. No CT ordered she will discuss this with the pulmonologist.I personally performed the services described in this documentation, which was scribed in my presence. The recorded information has been reviewed and is accurate.  Gross sideeffects, risk and benefits, and alternatives of medications d/w patient. Patient is aware that all medications have potential sideeffects and we are unable to predict every sideeffect or drug-drug interaction that may occur.  Tiffany Queen MD 04/12/2016 10:34 AM

## 2016-04-13 ENCOUNTER — Encounter: Payer: Self-pay | Admitting: Radiology

## 2016-04-13 LAB — HEPATITIS C ANTIBODY: HCV Ab: NEGATIVE

## 2016-04-18 ENCOUNTER — Other Ambulatory Visit: Payer: Self-pay

## 2016-04-18 DIAGNOSIS — R05 Cough: Secondary | ICD-10-CM

## 2016-04-18 DIAGNOSIS — R059 Cough, unspecified: Secondary | ICD-10-CM

## 2016-04-18 NOTE — Telephone Encounter (Signed)
Dr Everlene Farrier, pharm is requesting RF of benzonatate that pt was given for a prev illness. You saw her last week for a cough and referred to pulmonology. Do you want to Rx this cough med?

## 2016-04-20 MED ORDER — BENZONATATE 200 MG PO CAPS: 200.0000 mg | ORAL_CAPSULE | Freq: Two times a day (BID) | ORAL | Status: DC | PRN

## 2016-05-03 ENCOUNTER — Other Ambulatory Visit: Payer: Self-pay | Admitting: Gynecology

## 2016-05-03 DIAGNOSIS — Z1231 Encounter for screening mammogram for malignant neoplasm of breast: Secondary | ICD-10-CM

## 2016-06-15 ENCOUNTER — Ambulatory Visit (INDEPENDENT_AMBULATORY_CARE_PROVIDER_SITE_OTHER): Payer: BLUE CROSS/BLUE SHIELD | Admitting: Pulmonary Disease

## 2016-06-15 ENCOUNTER — Other Ambulatory Visit (INDEPENDENT_AMBULATORY_CARE_PROVIDER_SITE_OTHER): Payer: BLUE CROSS/BLUE SHIELD

## 2016-06-15 ENCOUNTER — Encounter: Payer: Self-pay | Admitting: Pulmonary Disease

## 2016-06-15 VITALS — BP 112/84 | HR 80 | Ht 63.0 in | Wt 136.0 lb

## 2016-06-15 DIAGNOSIS — J4540 Moderate persistent asthma, uncomplicated: Secondary | ICD-10-CM

## 2016-06-15 LAB — CBC WITH DIFFERENTIAL/PLATELET
Basophils Absolute: 0 10*3/uL (ref 0.0–0.1)
Basophils Relative: 0.2 % (ref 0.0–3.0)
Eosinophils Absolute: 0.1 10*3/uL (ref 0.0–0.7)
Eosinophils Relative: 1.9 % (ref 0.0–5.0)
HCT: 42.1 % (ref 36.0–46.0)
Hemoglobin: 13.9 g/dL (ref 12.0–15.0)
Lymphocytes Relative: 17.3 % (ref 12.0–46.0)
Lymphs Abs: 1 10*3/uL (ref 0.7–4.0)
MCHC: 33.1 g/dL (ref 30.0–36.0)
MCV: 85.2 fl (ref 78.0–100.0)
Monocytes Absolute: 0.7 10*3/uL (ref 0.1–1.0)
Monocytes Relative: 11.2 % (ref 3.0–12.0)
Neutro Abs: 4.1 10*3/uL (ref 1.4–7.7)
Neutrophils Relative %: 69.4 % (ref 43.0–77.0)
Platelets: 248 10*3/uL (ref 150.0–400.0)
RBC: 4.94 Mil/uL (ref 3.87–5.11)
RDW: 13.4 % (ref 11.5–15.5)
WBC: 5.9 10*3/uL (ref 4.0–10.5)

## 2016-06-15 LAB — NITRIC OXIDE: Nitric Oxide: 10

## 2016-06-15 NOTE — Patient Instructions (Signed)
We will schedule you for a CBC with diff and blood allergy panel. Continue using the dulera.  Return in 6 months

## 2016-06-15 NOTE — Progress Notes (Signed)
   Subjective:    Patient ID: Tiffany Cline, female    DOB: 10/18/1961, 55 y.o.   MRN: [id number]  HPI Consult for evaluation of asthma.  [name] is a 55 year old with past medical history of asthma, hypertension. She was diagnosed around 2011 and has been maintained on Advair and more recently [facility]. She has good control for most year but does have worsening of symptoms in wintertime. She has seasonal allergies, occasional postnasal drip. She denies any heartburn symptoms. She is allergic to cats and not toxic. She has a dog at home. There is no exposures at work or at home, no mold issues.  DATA CXR 04/12/16- Mild rt apical scarring Feno 06/15/16-10  PFTs 06/02/15 FVC 3.02 [110%) FEV1 2.51 (115%) F/F 83 Normal spirometry  Social history: She is a never smoker, no alcohol, drug use. She works as a Software engineer at [employer].  Family history: Father-prostate cancer, hypertension, thyroid disease. Mother-diabetes, hypertension.  Review of Systems Denies any cough, sputum production, dyspnea, wheezing. Denies any fevers, chills. Denies any nausea, vomiting, diarrhea, consultation. All other review of systems are negative.    Objective:   Physical Exam Blood pressure 112/84, pulse 80, height 5\' 3"  (1.6 m), weight 136 lb (61.689 kg), SpO2 98 %. Gen: No apparent distress Neuro: No gross focal deficits. HEENT: No JVD, lymphadenopathy, thyromegaly. RS: Clear, No wheeze or crackles CVS: S1-S2 heard, no murmurs rubs gallops. Abdomen: Soft, positive bowel sounds. Musculoskeletal: No edema.    Assessment & Plan:  Mild intermittent asthma Allergic rhinitis  She appears well controlled on dulera. FeNO is low today. She had tried to wean herself off the inhaler in past but symptoms worsened. We will continue the same without any change. I'll check CBC with differential and blood allergy panel for baseline assessment today.  Chest x-ray shows mild apical scarring. This is not markedly  different from a previous x-ray in 2011. We'll continue to monitor. Further eval with CT was discussed but she declined.   Plan: - Continue dulera - CBC with diff, blood allergy panel  Marshell Garfinkel MD [facility] Pager [phone] If no answer or after 3pm call: [phone] 06/15/2016, 9:30 AM

## 2016-06-16 LAB — RESPIRATORY ALLERGY PROFILE REGION II ~~LOC~~
Allergen, Cedar tree, t12: <0.1 kU/L
Allergen, Comm Silver Birch, t9: <0.1 kU/L
Allergen, Cottonwood, t14: <0.1 kU/L
Allergen, D pternoyssinus,d7: <0.1 kU/L
Allergen, Mouse Urine Protein, e78: <0.1 kU/L
Allergen, Mulberry, t76: <0.1 kU/L
Allergen, Oak,t7: <0.1 kU/L
Alternaria Alternata: 2.32 kU/L — ABNORMAL HIGH
Aspergillus fumigatus, m3: <0.1 kU/L
Bermuda Grass: <0.1 kU/L
Box Elder IgE: <0.1 kU/L
Cat Dander: <0.1 kU/L
Cladosporium Herbarum: <0.1 kU/L
Cockroach: <0.1 kU/L
Common Ragweed: <0.1 kU/L
D. farinae: <0.1 kU/L
Dog Dander: <0.1 kU/L
Elm IgE: <0.1 kU/L
IgE (Immunoglobulin E), Serum: 42 kU/L (ref <115)
Johnson Grass: <0.1 kU/L
Pecan/Hickory Tree IgE: <0.1 kU/L
Penicillium Notatum: <0.1 kU/L
Rough Pigweed  IgE: <0.1 kU/L
Sheep Sorrel IgE: <0.1 kU/L
Timothy Grass: <0.1 kU/L

## 2016-06-27 ENCOUNTER — Ambulatory Visit
Admission: RE | Admit: 2016-06-27 | Discharge: 2016-06-27 | Disposition: A | Discharge status: Home / Self Care | Payer: BLUE CROSS/BLUE SHIELD | Source: Ambulatory Visit | Attending: Gynecology | Admitting: Gynecology

## 2016-06-27 DIAGNOSIS — Z1231 Encounter for screening mammogram for malignant neoplasm of breast: Secondary | ICD-10-CM

## 2016-07-14 ENCOUNTER — Other Ambulatory Visit: Payer: Self-pay | Admitting: Physician Assistant

## 2016-07-14 DIAGNOSIS — M542 Cervicalgia: Secondary | ICD-10-CM

## 2016-07-24 ENCOUNTER — Encounter: Payer: Self-pay | Admitting: Physician Assistant

## 2016-07-24 ENCOUNTER — Ambulatory Visit (INDEPENDENT_AMBULATORY_CARE_PROVIDER_SITE_OTHER): Payer: BLUE CROSS/BLUE SHIELD | Admitting: Physician Assistant

## 2016-07-24 VITALS — BP 108/72 | HR 84 | Temp 98.7°F | Resp 16 | Ht 64.0 in | Wt 138.0 lb

## 2016-07-24 DIAGNOSIS — M5442 Lumbago with sciatica, left side: Secondary | ICD-10-CM

## 2016-07-24 MED ORDER — PREDNISONE 20 MG PO TABS: ORAL_TABLET | ORAL | 0 refills | Status: DC

## 2016-07-24 MED ORDER — CYCLOBENZAPRINE HCL 10 MG PO TABS: ORAL_TABLET | ORAL | 4 refills | Status: DC

## 2016-07-24 NOTE — Patient Instructions (Addendum)
   IF you received an x-ray today, you will receive an invoice from [facility]. Please contact [facility] at [phone] with questions or concerns regarding your invoice.   IF you received labwork today, you will receive an invoice from [facility]. Please contact [facility] at [phone] with questions or concerns regarding your invoice.   Our billing staff will not be able to assist you with questions regarding bills from these companies.  You will be contacted with the lab results as soon as they are available. The fastest way to get your results is to activate your My Chart account. Instructions are located on the last page of this paperwork. If you have not heard from us regarding the results in 2 weeks, please contact this office.      Sciatica With Rehab The sciatic nerve runs from the back down the leg and is responsible for sensation and control of the muscles in the back (posterior) side of the thigh, lower leg, and foot. Sciatica is a condition that is characterized by inflammation of this nerve.  SYMPTOMS   Signs of nerve damage, including numbness and/or weakness along the posterior side of the lower extremity.  Pain in the back of the thigh that may also travel down the leg.  Pain that worsens when sitting for long periods of time.  Occasionally, pain in the back or buttock. CAUSES  Inflammation of the sciatic nerve is the cause of sciatica. The inflammation is due to something irritating the nerve. Common sources of irritation include:  Sitting for long periods of time.  Direct trauma to the nerve.  Arthritis of the spine.  Herniated or ruptured disk.  Slipping of the vertebrae (spondylolisthesis).  Pressure from soft tissues, such as muscles or ligament-like tissue (fascia). RISK INCREASES WITH:  Sports that place pressure or stress on the spine (football or weightlifting).  Poor strength and  flexibility.  Failure to warm up properly before activity.  Family history of low back pain or disk disorders.  Previous back injury or surgery.  Poor body mechanics, especially when lifting, or poor posture. PREVENTION   Warm up and stretch properly before activity.  Maintain physical fitness:  Strength, flexibility, and endurance.  Cardiovascular fitness.  Learn and use proper technique, especially with posture and lifting. When possible, have coach correct improper technique.  Avoid activities that place stress on the spine. PROGNOSIS If treated properly, then sciatica usually resolves within 6 weeks. However, occasionally surgery is necessary.  RELATED COMPLICATIONS   Permanent nerve damage, including pain, numbness, tingle, or weakness.  Chronic back pain.  Risks of surgery: infection, bleeding, nerve damage, or damage to surrounding tissues. TREATMENT Treatment initially involves resting from any activities that aggravate your symptoms. The use of ice and medication may help reduce pain and inflammation. The use of strengthening and stretching exercises may help reduce pain with activity. These exercises may be performed at home or with referral to a therapist. A therapist may recommend further treatments, such as transcutaneous electronic nerve stimulation (TENS) or ultrasound. Your caregiver may recommend corticosteroid injections to help reduce inflammation of the sciatic nerve. If symptoms persist despite non-surgical (conservative) treatment, then surgery may be recommended. MEDICATION  If pain medication is necessary, then nonsteroidal anti-inflammatory medications, such as aspirin and ibuprofen, or other minor pain relievers, such as acetaminophen, are often recommended.  Do not take pain medication for 7 days before surgery.  Prescription pain relievers may be given if deemed necessary by your   caregiver. Use only as directed and only as much as you  need.  Ointments applied to the skin may be helpful.  Corticosteroid injections may be given by your caregiver. These injections should be reserved for the most serious cases, because they may only be given a certain number of times. HEAT AND COLD  Cold treatment (icing) relieves pain and reduces inflammation. Cold treatment should be applied for 10 to 15 minutes every 2 to 3 hours for inflammation and pain and immediately after any activity that aggravates your symptoms. Use ice packs or massage the area with a piece of ice (ice massage).  Heat treatment may be used prior to performing the stretching and strengthening activities prescribed by your caregiver, physical therapist, or athletic trainer. Use a heat pack or soak the injury in warm water. SEEK MEDICAL CARE IF:  Treatment seems to offer no benefit, or the condition worsens.  Any medications produce adverse side effects. EXERCISES  RANGE OF MOTION (ROM) AND STRETCHING EXERCISES - Sciatica Most people with sciatic will find that their symptoms worsen with either excessive bending forward (flexion) or arching at the low back (extension). The exercises which will help resolve your symptoms will focus on the opposite motion. Your physician, physical therapist or athletic trainer will help you determine which exercises will be most helpful to resolve your low back pain. Do not complete any exercises without first consulting with your clinician. Discontinue any exercises which worsen your symptoms until you speak to your clinician. If you have pain, numbness or tingling which travels down into your buttocks, leg or foot, the goal of the therapy is for these symptoms to move closer to your back and eventually resolve. Occasionally, these leg symptoms will get better, but your low back pain may worsen; this is typically an indication of progress in your rehabilitation. Be certain to be very alert to any changes in your symptoms and the activities in  which you participated in the 24 hours prior to the change. Sharing this information with your clinician will allow him/her to most efficiently treat your condition. These exercises may help you when beginning to rehabilitate your injury. Your symptoms may resolve with or without further involvement from your physician, physical therapist or athletic trainer. While completing these exercises, remember:   Restoring tissue flexibility helps normal motion to return to the joints. This allows healthier, less painful movement and activity.  An effective stretch should be held for at least 30 seconds.  A stretch should never be painful. You should only feel a gentle lengthening or release in the stretched tissue. FLEXION RANGE OF MOTION AND STRETCHING EXERCISES: STRETCH - Flexion, Single Knee to Chest   Lie on a firm bed or floor with both legs extended in front of you.  Keeping one leg in contact with the floor, bring your opposite knee to your chest. Hold your leg in place by either grabbing behind your thigh or at your knee.  Pull until you feel a gentle stretch in your low back. Hold ____5-10______ seconds.  Slowly release your grasp and repeat the exercise with the opposite side. Repeat ____5-10______ times. Complete this exercise _____1-2_____ times per day.  STRETCH - Flexion, Double Knee to Chest  Lie on a firm bed or floor with both legs extended in front of you.  Keeping one leg in contact with the floor, bring your opposite knee to your chest.  Tense your stomach muscles to support your back and then lift your other knee to  your chest. Hold your legs in place by either grabbing behind your thighs or at your knees.  Pull both knees toward your chest until you feel a gentle stretch in your low back. Hold __________ seconds.  Tense your stomach muscles and slowly return one leg at a time to the floor. Repeat __________ times. Complete this exercise __________ times per day.  STRETCH  - Low Trunk Rotation   Lie on a firm bed or floor. Keeping your legs in front of you, bend your knees so they are both pointed toward the ceiling and your feet are flat on the floor.  Extend your arms out to the side. This will stabilize your upper body by keeping your shoulders in contact with the floor.  Gently and slowly drop both knees together to one side until you feel a gentle stretch in your low back. Hold for __________ seconds.  Tense your stomach muscles to support your low back as you bring your knees back to the starting position. Repeat the exercise to the other side. Repeat __________ times. Complete this exercise __________ times per day  EXTENSION RANGE OF MOTION AND FLEXIBILITY EXERCISES: STRETCH - Extension, Prone on Elbows  Lie on your stomach on the floor, a bed will be too soft. Place your palms about shoulder width apart and at the height of your head.  Place your elbows under your shoulders. If this is too painful, stack pillows under your chest.  Allow your body to relax so that your hips drop lower and make contact more completely with the floor.  Hold this position for __________ seconds.  Slowly return to lying flat on the floor. Repeat __________ times. Complete this exercise __________ times per day.  RANGE OF MOTION - Extension, Prone Press Ups  Lie on your stomach on the floor, a bed will be too soft. Place your palms about shoulder width apart and at the height of your head.  Keeping your back as relaxed as possible, slowly straighten your elbows while keeping your hips on the floor. You may adjust the placement of your hands to maximize your comfort. As you gain motion, your hands will come more underneath your shoulders.  Hold this position __________ seconds.  Slowly return to lying flat on the floor. Repeat __________ times. Complete this exercise __________ times per day.  STRENGTHENING EXERCISES - Sciatica  These exercises may help you when  beginning to rehabilitate your injury. These exercises should be done near your "sweet spot." This is the neutral, low-back arch, somewhere between fully rounded and fully arched, that is your least painful position. When performed in this safe range of motion, these exercises can be used for people who have either a flexion or extension based injury. These exercises may resolve your symptoms with or without further involvement from your physician, physical therapist or athletic trainer. While completing these exercises, remember:   Muscles can gain both the endurance and the strength needed for everyday activities through controlled exercises.  Complete these exercises as instructed by your physician, physical therapist or athletic trainer. Progress with the resistance and repetition exercises only as your caregiver advises.  You may experience muscle soreness or fatigue, but the pain or discomfort you are trying to eliminate should never worsen during these exercises. If this pain does worsen, stop and make certain you are following the directions exactly. If the pain is still present after adjustments, discontinue the exercise until you can discuss the trouble with your clinician. STRENGTHENING - Deep  Abdominals, Pelvic Tilt   Lie on a firm bed or floor. Keeping your legs in front of you, bend your knees so they are both pointed toward the ceiling and your feet are flat on the floor.  Tense your lower abdominal muscles to press your low back into the floor. This motion will rotate your pelvis so that your tail bone is scooping upwards rather than pointing at your feet or into the floor.  With a gentle tension and even breathing, hold this position for __________ seconds. Repeat __________ times. Complete this exercise __________ times per day.  STRENGTHENING - Abdominals, Crunches   Lie on a firm bed or floor. Keeping your legs in front of you, bend your knees so they are both pointed toward the  ceiling and your feet are flat on the floor. Cross your arms over your chest.  Slightly tip your chin down without bending your neck.  Tense your abdominals and slowly lift your trunk high enough to just clear your shoulder blades. Lifting higher can put excessive stress on the low back and does not further strengthen your abdominal muscles.  Control your return to the starting position. Repeat __________ times. Complete this exercise __________ times per day.  STRENGTHENING - Quadruped, Opposite UE/LE Lift  Assume a hands and knees position on a firm surface. Keep your hands under your shoulders and your knees under your hips. You may place padding under your knees for comfort.  Find your neutral spine and gently tense your abdominal muscles so that you can maintain this position. Your shoulders and hips should form a rectangle that is parallel with the floor and is not twisted.  Keeping your trunk steady, lift your right hand no higher than your shoulder and then your left leg no higher than your hip. Make sure you are not holding your breath. Hold this position __________ seconds.  Continuing to keep your abdominal muscles tense and your back steady, slowly return to your starting position. Repeat with the opposite arm and leg. Repeat __________ times. Complete this exercise __________ times per day.  STRENGTHENING - Abdominals and Quadriceps, Straight Leg Raise   Lie on a firm bed or floor with both legs extended in front of you.  Keeping one leg in contact with the floor, bend the other knee so that your foot can rest flat on the floor.  Find your neutral spine, and tense your abdominal muscles to maintain your spinal position throughout the exercise.  Slowly lift your straight leg off the floor about 6 inches for a count of 15, making sure to not hold your breath.  Still keeping your neutral spine, slowly lower your leg all the way to the floor. Repeat this exercise with each leg  __________ times. Complete this exercise __________ times per day. POSTURE AND BODY MECHANICS CONSIDERATIONS - Sciatica Keeping correct posture when sitting, standing or completing your activities will reduce the stress put on different body tissues, allowing injured tissues a chance to heal and limiting painful experiences. The following are general guidelines for improved posture. Your physician or physical therapist will provide you with any instructions specific to your needs. While reading these guidelines, remember:  The exercises prescribed by your provider will help you have the flexibility and strength to maintain correct postures.  The correct posture provides the optimal environment for your joints to work. All of your joints have less wear and tear when properly supported by a spine with good posture. This means you will experience  a healthier, less painful body.  Correct posture must be practiced with all of your activities, especially prolonged sitting and standing. Correct posture is as important when doing repetitive low-stress activities (typing) as it is when doing a single heavy-load activity (lifting). RESTING POSITIONS Consider which positions are most painful for you when choosing a resting position. If you have pain with flexion-based activities (sitting, bending, stooping, squatting), choose a position that allows you to rest in a less flexed posture. You would want to avoid curling into a fetal position on your side. If your pain worsens with extension-based activities (prolonged standing, working overhead), avoid resting in an extended position such as sleeping on your stomach. Most people will find more comfort when they rest with their spine in a more neutral position, neither too rounded nor too arched. Lying on a non-sagging bed on your side with a pillow between your knees, or on your back with a pillow under your knees will often provide some relief. Keep in mind, being in  any one position for a prolonged period of time, no matter how correct your posture, can still lead to stiffness. PROPER SITTING POSTURE In order to minimize stress and discomfort on your spine, you must sit with correct posture Sitting with good posture should be effortless for a healthy body. Returning to good posture is a gradual process. Many people can work toward this most comfortably by using various supports until they have the flexibility and strength to maintain this posture on their own. When sitting with proper posture, your ears will fall over your shoulders and your shoulders will fall over your hips. You should use the back of the chair to support your upper back. Your low back will be in a neutral position, just slightly arched. You may place a small pillow or folded towel at the base of your low back for support.  When working at a desk, create an environment that supports good, upright posture. Without extra support, muscles fatigue and lead to excessive strain on joints and other tissues. Keep these recommendations in mind: CHAIR:   A chair should be able to slide under your desk when your back makes contact with the back of the chair. This allows you to work closely.  The chair's height should allow your eyes to be level with the upper part of your monitor and your hands to be slightly lower than your elbows. BODY POSITION  Your feet should make contact with the floor. If this is not possible, use a foot rest.  Keep your ears over your shoulders. This will reduce stress on your neck and low back. INCORRECT SITTING POSTURES   If you are feeling tired and unable to assume a healthy sitting posture, do not slouch or slump. This puts excessive strain on your back tissues, causing more damage and pain. Healthier options include:  Using more support, like a lumbar pillow.  Switching tasks to something that requires you to be upright or walking.  Talking a brief walk.  Lying  down to rest in a neutral-spine position. PROLONGED STANDING WHILE SLIGHTLY LEANING FORWARD  When completing a task that requires you to lean forward while standing in one place for a long time, place either foot up on a stationary 2-4 inch high object to help maintain the best posture. When both feet are on the ground, the low back tends to lose its slight inward curve. If this curve flattens (or becomes too large), then the back and your other  joints will experience too much stress, fatigue more quickly and can cause pain.  CORRECT STANDING POSTURES Proper standing posture should be assumed with all daily activities, even if they only take a few moments, like when brushing your teeth. As in sitting, your ears should fall over your shoulders and your shoulders should fall over your hips. You should keep a slight tension in your abdominal muscles to brace your spine. Your tailbone should point down to the ground, not behind your body, resulting in an over-extended swayback posture.  INCORRECT STANDING POSTURES  Common incorrect standing postures include a forward head, locked knees and/or an excessive swayback. WALKING Walk with an upright posture. Your ears, shoulders and hips should all line-up. PROLONGED ACTIVITY IN A FLEXED POSITION When completing a task that requires you to bend forward at your waist or lean over a low surface, try to find a way to stabilize 3 of 4 of your limbs. You can place a hand or elbow on your thigh or rest a knee on the surface you are reaching across. This will provide you more stability so that your muscles do not fatigue as quickly. By keeping your knees relaxed, or slightly bent, you will also reduce stress across your low back. CORRECT LIFTING TECHNIQUES DO :   Assume a wide stance. This will provide you more stability and the opportunity to get as close as possible to the object which you are lifting.  Tense your abdominals to brace your spine; then bend at the  knees and hips. Keeping your back locked in a neutral-spine position, lift using your leg muscles. Lift with your legs, keeping your back straight.  Test the weight of unknown objects before attempting to lift them.  Try to keep your elbows locked down at your sides in order get the best strength from your shoulders when carrying an object.  Always ask for help when lifting heavy or awkward objects. INCORRECT LIFTING TECHNIQUES DO NOT:   Lock your knees when lifting, even if it is a small object.  Bend and twist. Pivot at your feet or move your feet when needing to change directions.  Assume that you cannot safely pick up a paperclip without proper posture.   This information is not intended to replace advice given to you by your health care provider. Make sure you discuss any questions you have with your health care provider.   Document Released: 11/13/2005 Document Revised: 03/30/2015 Document Reviewed: 02/25/2009 [employer] Interactive Patient Education [employer].

## 2016-07-24 NOTE — Progress Notes (Signed)
Patient ID: Tiffany Cline, female    DOB: 11/28/1960, 55 y.o.   MRN: [id number]  PCP: Jenny Reichmann, MD  Subjective:   Chief Complaint  Patient presents with  . Back Pain    lower, radiates down left leg, x 3 weeks    HPI Presents for evaluation of recurrent LBP with LEFT leg radiculopathy.  Ibuprofen and leftover flexeril at HS usually help, but she has run out. Some days, she does ok. Other days, like today, it's an all-day throb. Worked, standing all weekend. Sneezed this morning and was frozen in place for a minute due to the pain. A little numbness in the LEFT leg one day, but not recurrent. Has been forcing herself to go on her regular daily walks. No loss of bowel/bladder control. No saddle anesthesia.  Previous pain like this. No recalled trauma/injury.    Review of Systems As above.    Patient Active Problem List   Diagnosis Date Noted  . Abnormal CXR 04/12/2016  . Asthma, chronic 06/30/2014  . Unspecified essential hypertension 05/27/2013  . Allergic rhinitis 05/27/2013     Prior to Admission medications   Medication Sig Start Date End Date Taking? Authorizing Provider  albuterol (PROAIR HFA) 108 (90 Base) MCG/ACT inhaler INHALE TWO PUFFS BY MOUTH EVERY 4 HOURS AS NEEDED FOR WHEEZING, COUGH OR SHORTNESS OF BREATH 04/12/16  Yes [name], MD  amLODipine (NORVASC) 5 MG tablet One tablet daily 04/12/16  Yes [name], MD  cyclobenzaprine (FLEXERIL) 10 MG tablet TAKE ONE TABLET BY MOUTH THREE TIMES DAILY AS NEEDED FOR MUSCLE SPASMS 01/20/15  Yes [name], PA-C  fluticasone (FLONASE) 50 MCG/ACT nasal spray USE TWO SPRAY(S) IN EACH NOSTRIL ONCE DAILY 04/12/16  Yes [name], MD  GuaiFENesin (MUCINEX PO) Take by mouth.     Yes Historical Provider, MD  mometasone-formoterol [place]) 200-5 MCG/ACT AERO 2 puffs into the lungs twice a day 04/12/16  Yes [name], MD  montelukast (SINGULAIR) 10 MG tablet TAKE ONE TABLET BY MOUTH  DAILY AT BEDTIME  04/12/16  Yes [name], MD  Norethin Ace-Eth Estrad-FE (TAYTULLA) 1-20 MG-MCG(24) CAPS Take 1-20 mcg by mouth daily. 12/21/15  Yes [name], NP  UNABLE TO FIND Nebulizer machine. Dx code J45.31 10/29/14  Yes [name], MD  benzonatate (TESSALON) 200 MG capsule Take 1 capsule (200 mg total) by mouth 2 (two) times daily as needed for cough. Patient not taking: Reported on 07/24/2016 04/20/16   [name], MD     Allergies  Allergen Reactions  . Septra [Sulfamethoxazole-Trimethoprim] Shortness Of Breath and Rash  . Latex Itching and Rash    Itching and redness with rash with use of latex gloves when she had blood drawn  . Sulfa Antibiotics Rash       Objective:  Physical Exam  Constitutional: She is oriented to person, place, and time. She appears well-developed and well-nourished. She is active and cooperative. No distress.  BP 108/72 (BP Location: Right Arm, Patient Position: Sitting, Cuff Size: Normal)   Pulse 84   Temp 98.7 F (37.1 C)   Resp 16   Ht 5\' 4"  (1.626 m)   Wt 138 lb (62.6 kg)   LMP 07/07/2016   SpO2 98%   BMI 23.69 kg/m   HENT:  Head: Normocephalic and atraumatic.  Right Ear: Hearing normal.  Left Ear: Hearing normal.  Eyes: Conjunctivae are normal. No scleral icterus.  Neck: Normal range of motion.  Neck supple. No thyromegaly present.  Cardiovascular: Normal rate, regular rhythm and normal heart sounds.   Pulses:      Radial pulses are 2+ on the right side, and 2+ on the left side.  Pulmonary/Chest: Effort normal and breath sounds normal.  Musculoskeletal:       Left hip: Normal.       Cervical back: Normal.       Thoracic back: Normal.       Lumbar back: She exhibits tenderness and pain. She exhibits normal range of motion, no bony tenderness, no swelling, no edema, no deformity, no laceration, no spasm and normal pulse.       Back:  Lymphadenopathy:       Head (right side): No tonsillar, no preauricular, no posterior auricular and no  occipital adenopathy present.       Head (left side): No tonsillar, no preauricular, no posterior auricular and no occipital adenopathy present.    She has no cervical adenopathy.       Right: No supraclavicular adenopathy present.       Left: No supraclavicular adenopathy present.  Neurological: She is alert and oriented to person, place, and time. She has normal strength. No sensory deficit.  Reflex Scores:      Patellar reflexes are 2+ on the right side and 2+ on the left side.      Achilles reflexes are 2+ on the right side and 2+ on the left side. LEFT great toe weak in extension compared to the right.  Skin: Skin is warm, dry and intact. No rash noted. No cyanosis or erythema. Nails show no clubbing.  Psychiatric: She has a normal mood and affect. Her speech is normal and behavior is normal.           Assessment & Plan:   1. Left-sided low back pain with left-sided sciatica Oral prednisone taper, then resume OTC NSAID. Cyclobenzaprine at HS, or TID if she can be at home and rest. Continue mobility. - cyclobenzaprine (FLEXERIL) 10 MG tablet; TAKE ONE TABLET BY MOUTH THREE TIMES DAILY AS NEEDED FOR MUSCLE SPASMS  Dispense: 30 tablet; Refill: 4 - predniSONE (DELTASONE) 20 MG tablet; Take 3 PO QAM x3days, 2 PO QAM x3days, 1 PO QAM x3days  Dispense: 18 tablet; Refill: 0   Fara Chute, PA-C Physician Assistant-Certified [facility]

## 2016-07-28 ENCOUNTER — Other Ambulatory Visit: Payer: Self-pay | Admitting: Emergency Medicine

## 2016-07-31 ENCOUNTER — Encounter: Payer: Self-pay | Admitting: Emergency Medicine

## 2016-09-19 ENCOUNTER — Ambulatory Visit (INDEPENDENT_AMBULATORY_CARE_PROVIDER_SITE_OTHER): Payer: BLUE CROSS/BLUE SHIELD | Admitting: Women's Health

## 2016-09-19 ENCOUNTER — Encounter: Payer: Self-pay | Admitting: Women's Health

## 2016-09-19 VITALS — BP 122/82 | Ht 64.0 in | Wt 140.0 lb

## 2016-09-19 DIAGNOSIS — Z13220 Encounter for screening for lipoid disorders: Secondary | ICD-10-CM

## 2016-09-19 DIAGNOSIS — N951 Menopausal and female climacteric states: Secondary | ICD-10-CM

## 2016-09-19 DIAGNOSIS — Z01419 Encounter for gynecological examination (general) (routine) without abnormal findings: Secondary | ICD-10-CM | POA: Diagnosis not present

## 2016-09-19 DIAGNOSIS — Z3041 Encounter for surveillance of contraceptive pills: Secondary | ICD-10-CM

## 2016-09-19 LAB — LIPID PANEL
Cholesterol: 189 mg/dL (ref 125–200)
HDL: 73 mg/dL (ref >=46)
LDL Cholesterol: 101 mg/dL (ref <130)
Total CHOL/HDL Ratio: 2.6 Ratio (ref <=5.0)
Triglycerides: 77 mg/dL (ref <150)
VLDL: 15 mg/dL (ref <30)

## 2016-09-19 MED ORDER — NORETHIN-ETH ESTRAD-FE BIPHAS 1 MG-10 MCG / 10 MCG PO TABS: ORAL_TABLET | ORAL | 4 refills | Status: DC

## 2016-09-19 NOTE — Progress Notes (Signed)
Tiffany Cline 03-02-1961 [id number]    History:    Presents for annual exam. Light cycle on Loestrin. Started on antihypertensives this past year. , 2000 myomectomy. Ultrasound 2012 showed approximate 10 fibroids about 2 cm each. [place] 3 2016, has had increased hot flushes this past year. Mother and several maternal aunts menopause late 16s. Normal Pap and mammogram history. Same partner. Reports slightly elevated lipid panel at primary care.  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  ROS:  A ROS was performed and pertinent positives and negatives are included.  Exam:  Vitals:   09/19/16 1402  BP: 122/82  Weight: 140 lb (63.5 kg)  Height: 5\' 4"  (1.626 m)   Body mass index is 24.03 kg/m.   General appearance:  Normal Thyroid:  Symmetrical, normal in size, without palpable masses or nodularity. Respiratory  Auscultation:  Clear without wheezing or rhonchi Cardiovascular  Auscultation:  Regular rate, without rubs, murmurs or gallops  Edema/varicosities:  Not grossly evident Abdominal  Soft,nontender, without masses, guarding or rebound.  Liver/spleen:  No organomegaly noted  Hernia:  None appreciated  Skin  Inspection:  Grossly normal   Breasts: Examined lying and sitting.     Right: Without masses, retractions, discharge or axillary adenopathy.     Left: Without masses, retractions, discharge or axillary adenopathy.Nipple inverted-always Gentitourinary   Inguinal/mons:  Normal without inguinal adenopathy  External genitalia:  Normal  BUS/Urethra/Skene's glands:  Normal  Vagina:  Normal  Cervix:  Normal  Uterus:  Enlarged/bulky 8-10 weeks' size ,   Midline and mobile  Adnexa/parametria:     Rt: Without masses or tenderness.   Lt: Without masses or tenderness.  Anus and perineum: Normal  Digital rectal exam: Normal sphincter tone  without palpated masses or tenderness  Assessment/Plan:  55 y.o. SBF G0 for annualwith no complaints.  Contraception management Hypertension stable on medication 1 year labs and meds primary care Fibroid uterus  Plan: Has not had a screening colonoscopy [facility] information given and reviewed importance of screening. SBE's, continue annual 3-D screening mammogram history of dense breasts. Contraception options reviewed will try Lo Loestrin, prescription, proper use given and reviewed slight risk for blood clots, strokes. Continue healthy exercise of regular exercise, healthy diet. Vitamin D 1000 daily encouraged. FSH, lipid panel, UA, Pap normal 2016, new screening guidelines reviewed.   [name] [facility], 5:23 PM 09/19/2016

## 2016-09-19 NOTE — Patient Instructions (Signed)
Colonoscopy  Dr [facility]  Health Maintenance, Female Adopting a healthy lifestyle and getting preventive care can go a long way to promote health and wellness. Talk with your health care provider about what schedule of regular examinations is right for you. This is a good chance for you to check in with your provider about disease prevention and staying healthy. In between checkups, there are plenty of things you can do on your own. Experts have done a lot of research about which lifestyle changes and preventive measures are most likely to keep you healthy. Ask your health care provider for more information. WEIGHT AND DIET  Eat a healthy diet  Be sure to include plenty of vegetables, fruits, low-fat dairy products, and lean protein.  Do not eat a lot of foods high in solid fats, added sugars, or salt.  Get regular exercise. This is one of the most important things you can do for your health.  Most adults should exercise for at least 150 minutes each week. The exercise should increase your heart rate and make you sweat (moderate-intensity exercise).  Most adults should also do strengthening exercises at least twice a week. This is in addition to the moderate-intensity exercise.  Maintain a healthy weight  Body mass index (BMI) is a measurement that can be used to identify possible weight problems. It estimates body fat based on height and weight. Your health care provider can help determine your BMI and help you achieve or maintain a healthy weight.  For females 46 years of age and older:   A BMI below 18.5 is considered underweight.  A BMI of 18.5 to 24.9 is normal.  A BMI of 25 to 29.9 is considered overweight.  A BMI of 30 and above is considered obese.  Watch levels of cholesterol and blood lipids  You should start having your blood tested for lipids and cholesterol at 53 years of age, then have this test every 5 years.  You may need to have your cholesterol levels  checked more often if:  Your lipid or cholesterol levels are high.  You are older than 53 years of age.  You are at high risk for heart disease.  CANCER SCREENING   Lung Cancer  Lung cancer screening is recommended for adults 59-46 years old who are at high risk for lung cancer because of a history of smoking.  A yearly low-dose CT scan of the lungs is recommended for people who:  Currently smoke.  Have quit within the past 15 years.  Have at least a 30-pack-year history of smoking. A pack year is smoking an average of one pack of cigarettes a day for 1 year.  Yearly screening should continue until it has been 15 years since you quit.  Yearly screening should stop if you develop a health problem that would prevent you from having lung cancer treatment.  Breast Cancer  Practice breast self-awareness. This means understanding how your breasts normally appear and feel.  It also means doing regular breast self-exams. Let your health care provider know about any changes, no matter how small.  If you are in your 20s or 30s, you should have a clinical breast exam (CBE) by a health care provider every 1-3 years as part of a regular health exam.  If you are 15 or older, have a CBE every year. Also consider having a breast X-ray (mammogram) every year.  If you have a family history of breast cancer, talk to your health  care provider about genetic screening.  If you are at high risk for breast cancer, talk to your health care provider about having an MRI and a mammogram every year.  Breast cancer gene (BRCA) assessment is recommended for women who have family members with BRCA-related cancers. BRCA-related cancers include:  Breast.  Ovarian.  Tubal.  Peritoneal cancers.  Results of the assessment will determine the need for genetic counseling and BRCA1 and BRCA2 testing. Cervical Cancer Your health care provider may recommend that you be screened regularly for cancer of the  pelvic organs (ovaries, uterus, and vagina). This screening involves a pelvic examination, including checking for microscopic changes to the surface of your cervix (Pap test). You may be encouraged to have this screening done every 3 years, beginning at age 74.  For women ages 37-65, health care providers may recommend pelvic exams and Pap testing every 3 years, or they may recommend the Pap and pelvic exam, combined with testing for human papilloma virus (HPV), every 5 years. Some types of HPV increase your risk of cervical cancer. Testing for HPV may also be done on women of any age with unclear Pap test results.  Other health care providers may not recommend any screening for nonpregnant women who are considered low risk for pelvic cancer and who do not have symptoms. Ask your health care provider if a screening pelvic exam is right for you.  If you have had past treatment for cervical cancer or a condition that could lead to cancer, you need Pap tests and screening for cancer for at least 20 years after your treatment. If Pap tests have been discontinued, your risk factors (such as having a new sexual partner) need to be reassessed to determine if screening should resume. Some women have medical problems that increase the chance of getting cervical cancer. In these cases, your health care provider may recommend more frequent screening and Pap tests. Colorectal Cancer  This type of cancer can be detected and often prevented.  Routine colorectal cancer screening usually begins at 53 years of age and continues through 53 years of age.  Your health care provider may recommend screening at an earlier age if you have risk factors for colon cancer.  Your health care provider may also recommend using home test kits to check for hidden blood in the stool.  A small camera at the end of a tube can be used to examine your colon directly (sigmoidoscopy or colonoscopy). This is done to check for the earliest  forms of colorectal cancer.  Routine screening usually begins at age 61.  Direct examination of the colon should be repeated every 5-10 years through 53 years of age. However, you may need to be screened more often if early forms of precancerous polyps or small growths are found. Skin Cancer  Check your skin from head to toe regularly.  Tell your health care provider about any new moles or changes in moles, especially if there is a change in a mole's shape or color.  Also tell your health care provider if you have a mole that is larger than the size of a pencil eraser.  Always use sunscreen. Apply sunscreen liberally and repeatedly throughout the day.  Protect yourself by wearing long sleeves, pants, a wide-brimmed hat, and sunglasses whenever you are outside. HEART DISEASE, DIABETES, AND HIGH BLOOD PRESSURE   High blood pressure causes heart disease and increases the risk of stroke. High blood pressure is more likely to develop in:  People  who have blood pressure in the high end of the normal range (130-139/85-89 mm Hg).  People who are overweight or obese.  People who are African American.  If you are 73-54 years of age, have your blood pressure checked every 3-5 years. If you are 55 years of age or older, have your blood pressure checked every year. You should have your blood pressure measured twice--once when you are at a hospital or clinic, and once when you are not at a hospital or clinic. Record the average of the two measurements. To check your blood pressure when you are not at a hospital or clinic, you can use:  An automated blood pressure machine at a pharmacy.  A home blood pressure monitor.  If you are between 11 years and 71 years old, ask your health care provider if you should take aspirin to prevent strokes.  Have regular diabetes screenings. This involves taking a blood sample to check your fasting blood sugar level.  If you are at a normal weight and have a low  risk for diabetes, have this test once every three years after 53 years of age.  If you are overweight and have a high risk for diabetes, consider being tested at a younger age or more often. PREVENTING INFECTION  Hepatitis B  If you have a higher risk for hepatitis B, you should be screened for this virus. You are considered at high risk for hepatitis B if:  You were born in a country where hepatitis B is common. Ask your health care provider which countries are considered high risk.  Your parents were born in a high-risk country, and you have not been immunized against hepatitis B (hepatitis B vaccine).  You have HIV or AIDS.  You use needles to inject street drugs.  You live with someone who has hepatitis B.  You have had sex with someone who has hepatitis B.  You get hemodialysis treatment.  You take certain medicines for conditions, including cancer, organ transplantation, and autoimmune conditions. Hepatitis C  Blood testing is recommended for:  Everyone born from [birth year] through 1963.  Anyone with known risk factors for hepatitis C. Sexually transmitted infections (STIs)  You should be screened for sexually transmitted infections (STIs) including gonorrhea and chlamydia if:  You are sexually active and are younger than 53 years of age.  You are older than 53 years of age and your health care provider tells you that you are at risk for this type of infection.  Your sexual activity has changed since you were last screened and you are at an increased risk for chlamydia or gonorrhea. Ask your health care provider if you are at risk.  If you do not have HIV, but are at risk, it may be recommended that you take a prescription medicine daily to prevent HIV infection. This is called pre-exposure prophylaxis (PrEP). You are considered at risk if:  You are sexually active and do not regularly use condoms or know the HIV status of your partner(s).  You take drugs by  injection.  You are sexually active with a partner who has HIV. Talk with your health care provider about whether you are at high risk of being infected with HIV. If you choose to begin PrEP, you should first be tested for HIV. You should then be tested every 3 months for as long as you are taking PrEP.  PREGNANCY   If you are premenopausal and you may become pregnant, ask your health care  provider about preconception counseling.  If you may become pregnant, take 400 to 800 micrograms (mcg) of folic acid every day.  If you want to prevent pregnancy, talk to your health care provider about birth control (contraception). OSTEOPOROSIS AND MENOPAUSE   Osteoporosis is a disease in which the bones lose minerals and strength with aging. This can result in serious bone fractures. Your risk for osteoporosis can be identified using a bone density scan.  If you are 37 years of age or older, or if you are at risk for osteoporosis and fractures, ask your health care provider if you should be screened.  Ask your health care provider whether you should take a calcium or vitamin D supplement to lower your risk for osteoporosis.  Menopause may have certain physical symptoms and risks.  Hormone replacement therapy may reduce some of these symptoms and risks. Talk to your health care provider about whether hormone replacement therapy is right for you.  HOME CARE INSTRUCTIONS   Schedule regular health, dental, and eye exams.  Stay current with your immunizations.   Do not use any tobacco products including cigarettes, chewing tobacco, or electronic cigarettes.  If you are pregnant, do not drink alcohol.  If you are breastfeeding, limit how much and how often you drink alcohol.  Limit alcohol intake to no more than 1 drink per day for nonpregnant women. One drink equals 12 ounces of beer, 5 ounces of wine, or 1 ounces of hard liquor.  Do not use street drugs.  Do not share needles.  Ask your  health care provider for help if you need support or information about quitting drugs.  Tell your health care provider if you often feel depressed.  Tell your health care provider if you have ever been abused or do not feel safe at home.   This information is not intended to replace advice given to you by your health care provider. Make sure you discuss any questions you have with your health care provider.   Document Released: 05/29/2011 Document Revised: 12/04/2014 Document Reviewed: 10/15/2013 Elsevier Interactive Patient Education [employer].

## 2016-09-20 LAB — URINALYSIS W MICROSCOPIC + REFLEX CULTURE
Bacteria, UA: NONE SEEN [HPF]
Bilirubin Urine: NEGATIVE
Casts: NONE SEEN [LPF]
Crystals: NONE SEEN [HPF]
Glucose, UA: NEGATIVE
Hgb urine dipstick: NEGATIVE
Ketones, ur: NEGATIVE
Leukocytes, UA: NEGATIVE
Nitrite: NEGATIVE
Protein, ur: NEGATIVE
Specific Gravity, Urine: 1.017 (ref 1.001–1.035)
Squamous Epithelial / LPF: NONE SEEN [HPF] (ref <=5)
WBC, UA: NONE SEEN WBC/HPF (ref <=5)
Yeast: NONE SEEN [HPF]
pH: 6.5 (ref 5.0–8.0)

## 2016-09-20 LAB — FOLLICLE STIMULATING HORMONE: FSH: <0.7 m[IU]/mL

## 2016-09-21 LAB — URINE CULTURE: Organism ID, Bacteria: NO GROWTH

## 2016-12-28 ENCOUNTER — Other Ambulatory Visit: Payer: Self-pay | Admitting: Physician Assistant

## 2016-12-28 NOTE — Telephone Encounter (Signed)
Managed by pulmonology now. Has visit 01/04/2017.

## 2016-12-29 ENCOUNTER — Other Ambulatory Visit: Payer: Self-pay

## 2016-12-29 MED ORDER — MOMETASONE FURO-FORMOTEROL FUM 200-5 MCG/ACT IN AERO: INHALATION_SPRAY | RESPIRATORY_TRACT | 0 refills | Status: DC

## 2016-12-29 NOTE — Telephone Encounter (Signed)
06/2016 last ov 

## 2017-01-04 ENCOUNTER — Ambulatory Visit (INDEPENDENT_AMBULATORY_CARE_PROVIDER_SITE_OTHER): Payer: BLUE CROSS/BLUE SHIELD | Admitting: Pulmonary Disease

## 2017-01-04 ENCOUNTER — Encounter: Payer: Self-pay | Admitting: Pulmonary Disease

## 2017-01-04 VITALS — BP 122/64 | HR 81 | Ht 64.0 in | Wt 141.6 lb

## 2017-01-04 DIAGNOSIS — J4520 Mild intermittent asthma, uncomplicated: Secondary | ICD-10-CM

## 2017-01-04 MED ORDER — AZELASTINE HCL 0.1 % NA SOLN: 2.0000 | Freq: Two times a day (BID) | NASAL | 12 refills | Status: DC

## 2017-01-04 MED ORDER — MOMETASONE FURO-FORMOTEROL FUM 100-5 MCG/ACT IN AERO: 2.0000 | INHALATION_SPRAY | Freq: Two times a day (BID) | RESPIRATORY_TRACT | 5 refills | Status: DC

## 2017-01-04 NOTE — Progress Notes (Addendum)
Tiffany Cline    LU:[id number]    November 14, 1961  Primary Care Physician:DAUB, Lina Sayre, MD  Referring Physician: [name], MD [address] [place], [place] [address]  Chief complaint:  Follow up for Mild intermittent asthma  HPI: Tiffany Cline is a 56 year old with past medical history of asthma, hypertension. She was diagnosed around 2011 and has been maintained on Advair and more recently [facility] She has good control for most year but does have worsening of symptoms in wintertime. She has seasonal allergies, occasional postnasal drip. She denies any heartburn symptoms. She is allergic to cats. She has a dog at home but is not sensitive to it. 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  Interim History: Her Advair has been switched to [facility] by her insurance company. She is doing well on it with no exacerbations. She has some sinus pressure and congestion for the past 2 days which she is treating with Claritin and netipot                                                                                                                                 Outpatient Encounter Prescriptions as of 01/04/2017  Medication Sig  . albuterol (PROAIR HFA) 108 (90 Base) MCG/ACT inhaler INHALE TWO PUFFS BY MOUTH EVERY 4 HOURS AS NEEDED FOR WHEEZING, COUGH OR SHORTNESS OF BREATH  . amLODipine (NORVASC) 5 MG tablet One tablet daily  . cyclobenzaprine (FLEXERIL) 10 MG tablet TAKE ONE TABLET BY MOUTH THREE TIMES DAILY AS NEEDED FOR MUSCLE SPASMS  . fluticasone (FLONASE) 50 MCG/ACT nasal spray USE TWO SPRAY(S) IN EACH NOSTRIL ONCE DAILY  . GuaiFENesin (MUCINEX PO) Take by mouth.    . mometasone-formoterol (DULERA) 200-5 MCG/ACT AERO 2 puffs into the lungs twice a day  . montelukast (SINGULAIR) 10 MG tablet TAKE ONE TABLET BY MOUTH  DAILY AT BEDTIME  . Norethindrone-Ethinyl Estradiol-Fe Biphas (LO LOESTRIN FE) 1 MG-10 MCG / 10 MCG tablet Take 1 tablet daily    . UNABLE TO FIND Nebulizer machine. Dx code J45.31   Facility-Administered Encounter Medications as of 01/04/2017  Medication  . ipratropium (ATROVENT) nebulizer solution 0.5 mg    Allergies as of 01/04/2017 - Review Complete 09/19/2016  Allergen Reaction Noted  . Septra [sulfamethoxazole-trimethoprim] Shortness Of Breath and Rash 12/03/2012  . Latex Itching and Rash 08/25/2015  . Sulfa antibiotics Rash 04/11/2011    Past Medical History:  Diagnosis Date  . Asthma    induced by cold and allergies  . Leiomyoma     Past Surgical History:  Procedure Laterality Date  . MYOMECTOMY  2001    Family History  Problem Relation Age of Onset  . Diabetes Mother   . Hypertension Mother   . Hypertension Father   . Cancer Father     prostate  . Thyroid disease Father  benign tumor  . Heart disease Maternal Grandmother   . Hypertension Paternal Grandmother   . Asthma Paternal Grandfather     Social History   Social History  . Marital status: Single    Spouse name: n/a  . Number of children: 0  . Years of education: RPh   Occupational History  . Pharmacist    Social History Main Topics  . Smoking status: Never Smoker  . Smokeless tobacco: Never Used  . Alcohol use No  . Drug use: No  . Sexual activity: Yes    Birth control/ protection: Pill   Other Topics Concern  . Not on file   Social History Narrative   Lives alone with her pets.   Family lives nearby.    Review of systems: Review of Systems  Constitutional: Negative for fever and chills.  HENT: Negative.   Eyes: Negative for blurred vision.  Respiratory: as per HPI  Cardiovascular: Negative for chest pain and palpitations.  Gastrointestinal: Negative for vomiting, diarrhea, blood per rectum. Genitourinary: Negative for dysuria, urgency, frequency and hematuria.  Musculoskeletal: Negative for myalgias, back pain and joint pain.  Skin: Negative for itching and rash.  Neurological: Negative for  dizziness, tremors, focal weakness, seizures and loss of consciousness.  Endo/Heme/Allergies: Negative for environmental allergies.  Psychiatric/Behavioral: Negative for depression, suicidal ideas and hallucinations.  All other systems reviewed and are negative.  Physical Exam: Blood pressure 122/64, pulse 81, height 5\' 4"  (1.626 m), weight 64.2 kg (141 lb 9.6 oz), SpO2 99 %. Gen:      No acute distress HEENT:  EOMI, sclera anicteric Neck:     No masses; no thyromegaly Lungs:    Clear to auscultation bilaterally; normal respiratory effort CV:         Regular rate and rhythm; no murmurs Abd:      + bowel sounds; soft, non-tender; no palpable masses, no distension Ext:    No edema; adequate peripheral perfusion Skin:      Warm and dry; no rash Neuro: alert and oriented x 3 Psych: normal mood and affect  Data Reviewed: CXR 04/12/16- Mild rt apical scarring. Images reviewed Feno 06/15/16-10  PFTs 06/02/15 FVC 3.02 [110%) FEV1 2.51 (115%) F/F 83 Normal spirometry  CBC with diff 06/15/16. WBC 5.9, absolute eosinophil count- 112 Blood allergy profile- 06/15/16- Negative, IgE 42  Assessment:  Mild intermittent asthma Her symptoms have been well controlled on Dulera with low FENO in the past. I believe we can start downgrading her inhalers. I'll switch to Dulera 100/50. She will call us if there is any worsening of symptoms with this change.  Sinusitiis, Allergic rhinitis Treating with over-the-counter Claritin and netipot. She is requesting a refill on her Astelin  Abnormal CXR Chest x-ray shows mild apical scarring which is not different from 2011. We'll continue to monitor. Further evaluated with a CT scan was discussed at last visit but she declined.  Plan/Recommendations: - Reduce dulera to 100/50 - Continue claritin and netipot - Send in prescription for astelin nasal spray  Return in 6 months  Marshell Garfinkel MD [facility] Pager [phone] 01/04/2017, 9:33 AM  CC: [name], MD

## 2017-01-04 NOTE — Patient Instructions (Addendum)
Continue the [facility]. Will reduce dose 100/50 bid We will give a prescription for astelin nasal spray  Call us if there is worsening of symptoms  Return in 6 months

## 2017-03-25 IMAGING — MG 2D DIGITAL SCREENING BILATERAL MAMMOGRAM WITH CAD AND ADJUNCT TO
9 of 14 series · 9 of 30 positions shown · non-contrast
Comparison: Previous exam(s).

CLINICAL DATA: Screening.

EXAM:
2D DIGITAL SCREENING BILATERAL MAMMOGRAM WITH CAD AND ADJUNCT TOMO

[L XCCL]
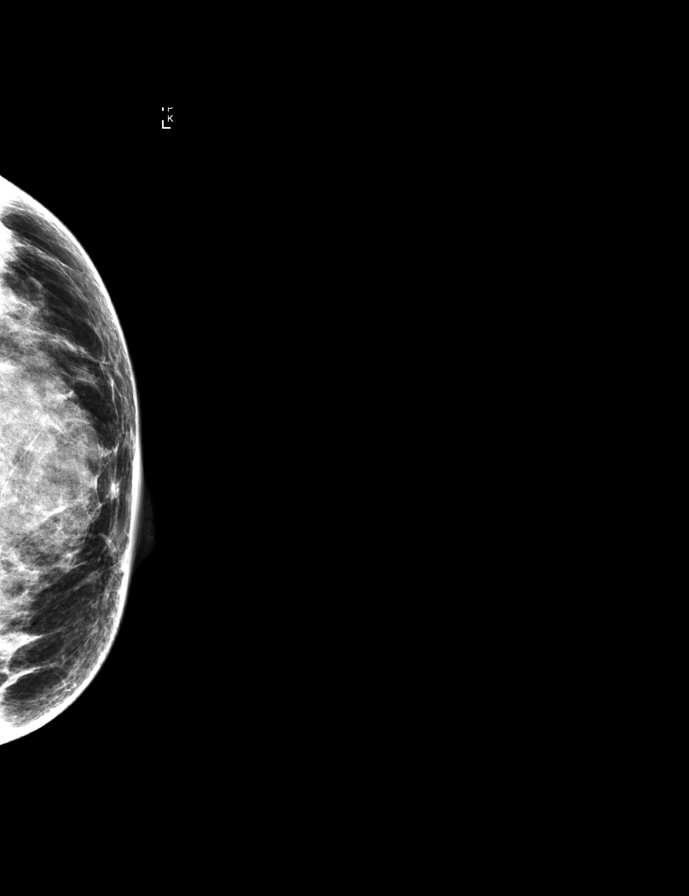

[R XCCL]
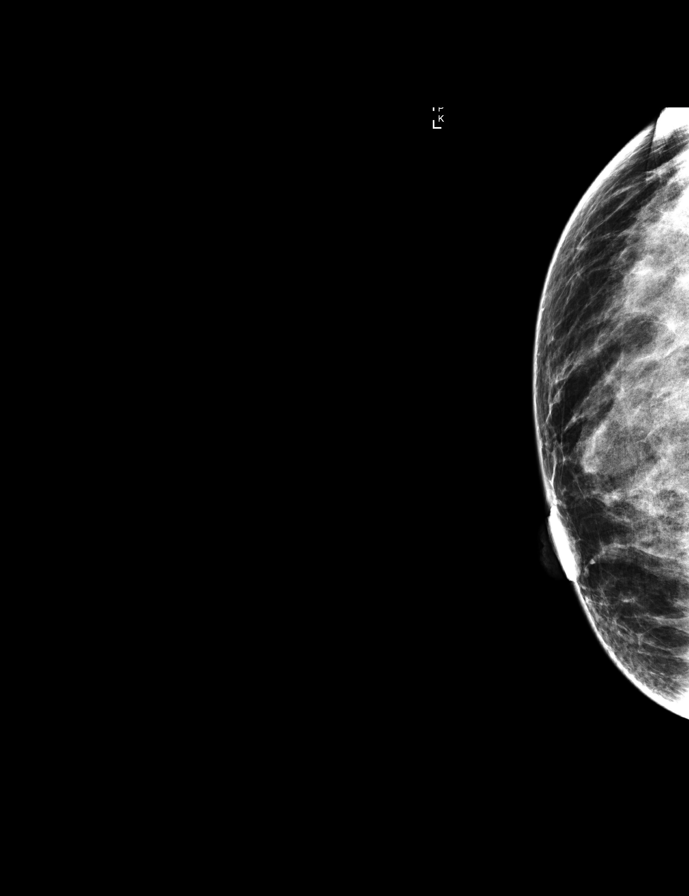

[R CC synth-2D]
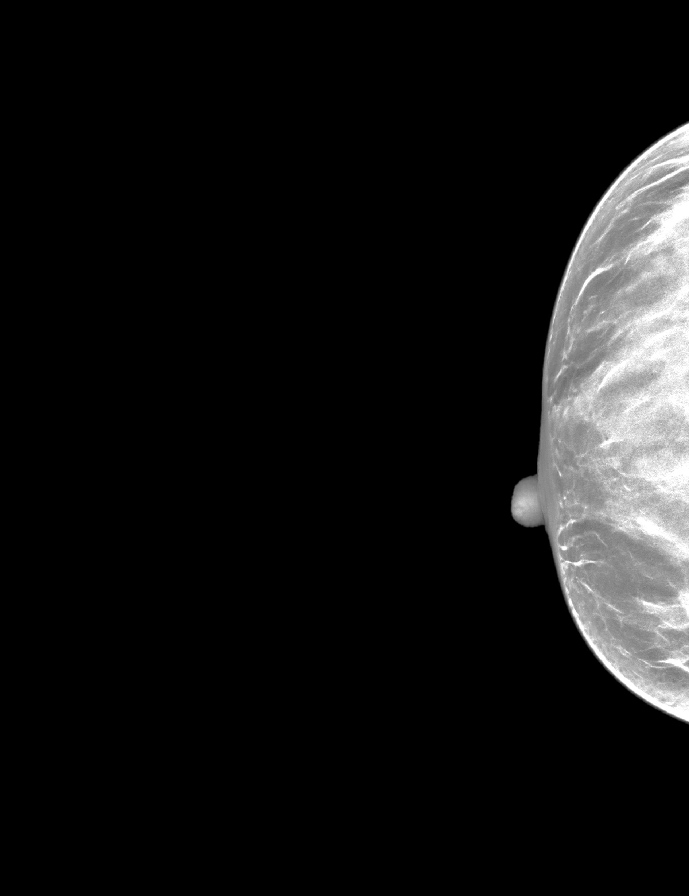

[R MLO]
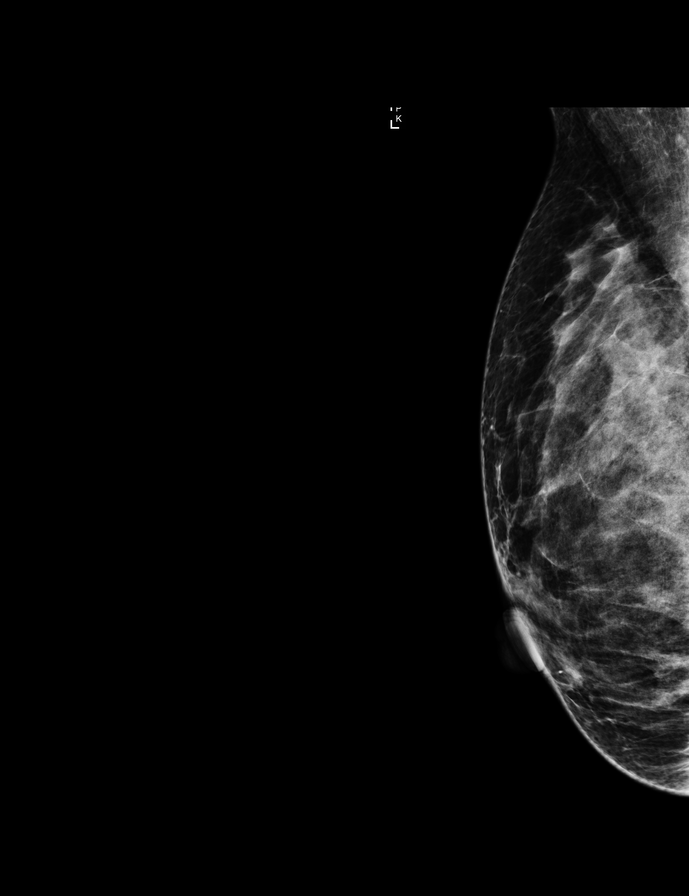

[L CC]
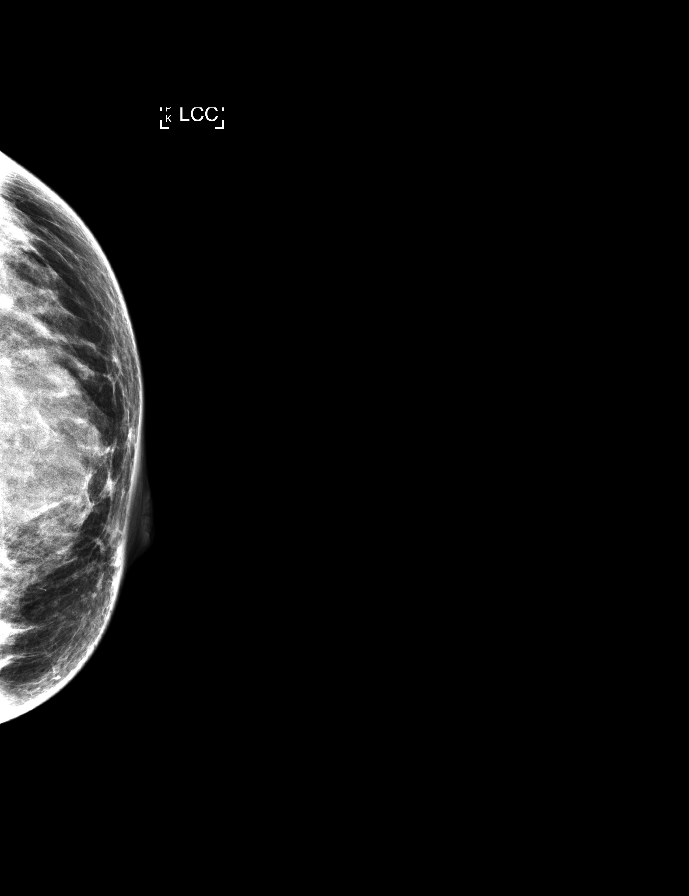

[L MLO]
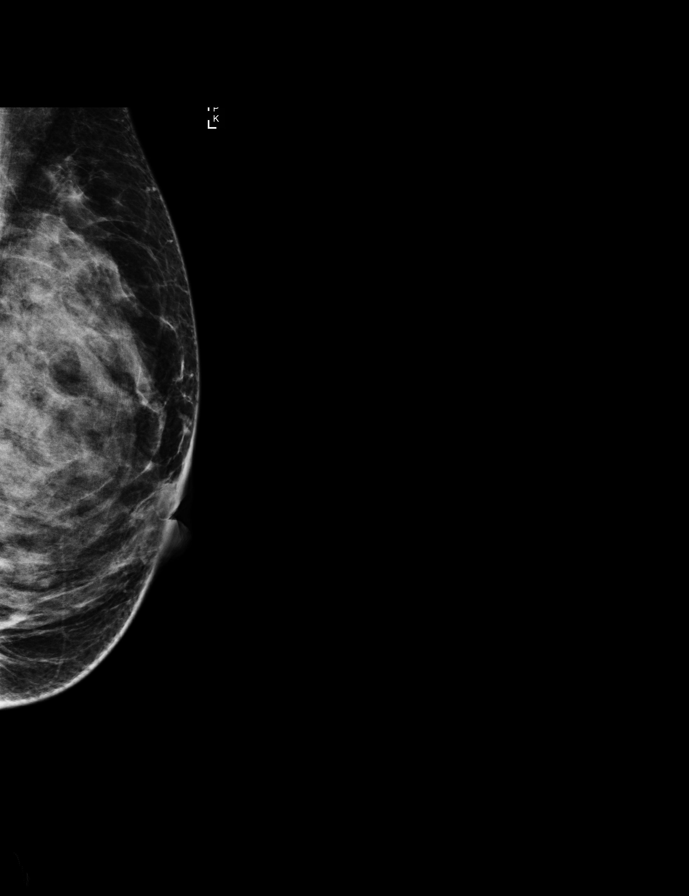

[R MLO synth-2D]
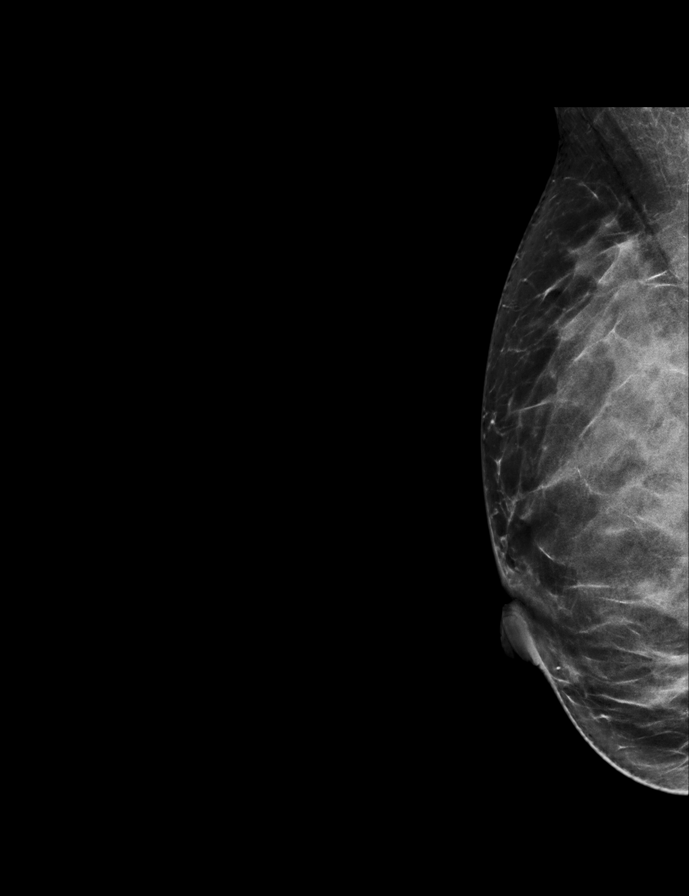

[L MLO synth-2D]
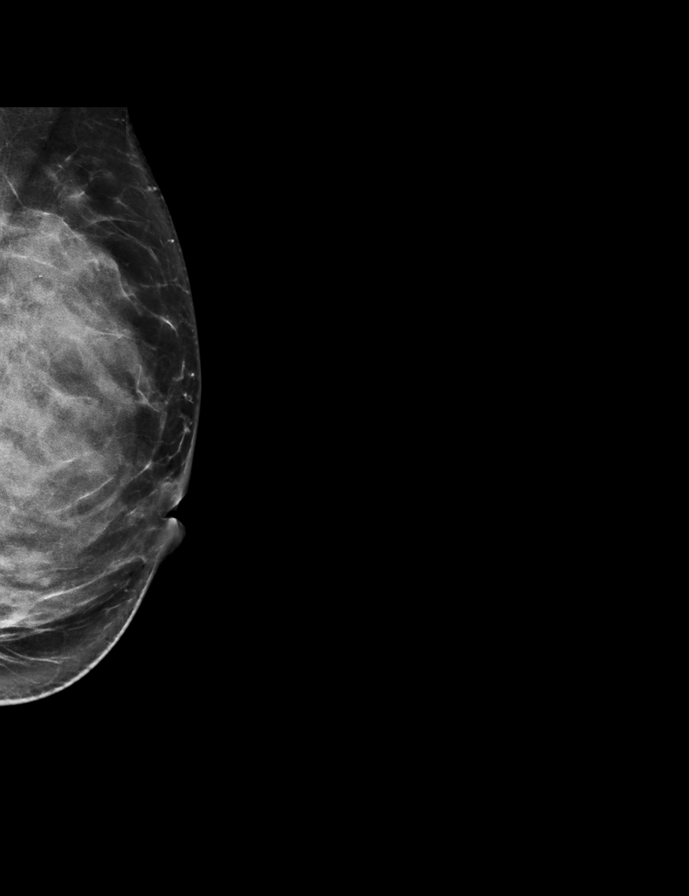

[L CC synth-2D]
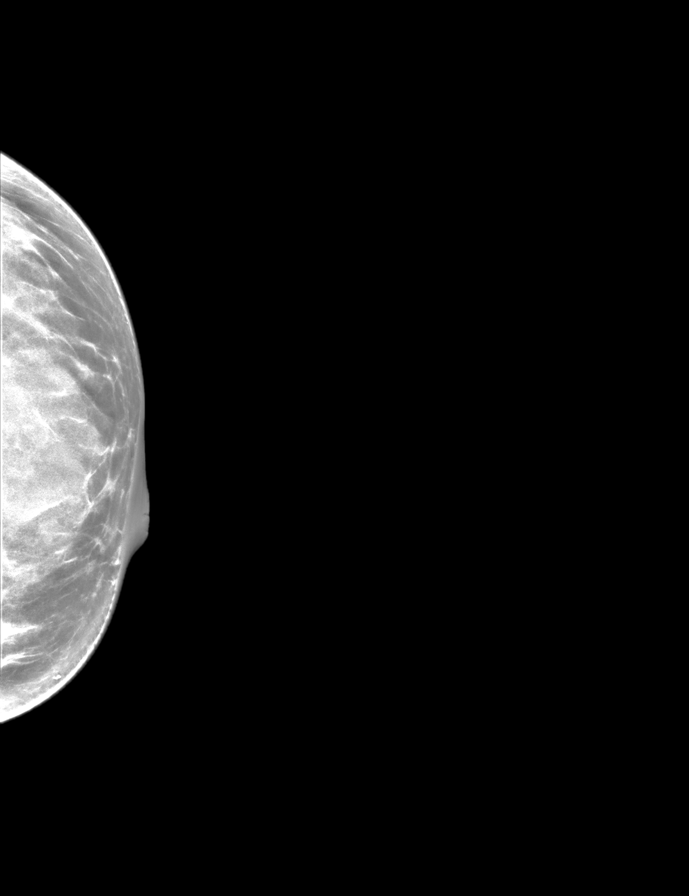

[9 of 30 positions shown; findings below may reference images not displayed]

ACR Breast Density Category c: The breast tissue is heterogeneously
dense, which may obscure small masses.
FINDINGS: There are no findings suspicious for malignancy. Images were
processed with CAD.
IMPRESSION: No mammographic evidence of malignancy. A result letter of this
screening mammogram will be mailed directly to the patient.

RECOMMENDATION:
Screening mammogram in one year. (Code:[id number])

BI-RADS CATEGORY  1: Negative.

## 2017-04-11 ENCOUNTER — Encounter: Payer: Self-pay | Admitting: Gynecology

## 2017-04-23 ENCOUNTER — Other Ambulatory Visit: Payer: Self-pay | Admitting: Emergency Medicine

## 2017-04-23 DIAGNOSIS — I10 Essential (primary) hypertension: Secondary | ICD-10-CM

## 2017-04-23 DIAGNOSIS — J4541 Moderate persistent asthma with (acute) exacerbation: Secondary | ICD-10-CM

## 2017-05-07 ENCOUNTER — Ambulatory Visit (INDEPENDENT_AMBULATORY_CARE_PROVIDER_SITE_OTHER): Payer: BLUE CROSS/BLUE SHIELD | Admitting: Physician Assistant

## 2017-05-07 ENCOUNTER — Encounter: Payer: Self-pay | Admitting: Physician Assistant

## 2017-05-07 VITALS — BP 129/79 | HR 68 | Temp 98.5°F | Resp 16 | Ht 63.5 in | Wt 139.6 lb

## 2017-05-07 DIAGNOSIS — I10 Essential (primary) hypertension: Secondary | ICD-10-CM | POA: Diagnosis not present

## 2017-05-07 DIAGNOSIS — J301 Allergic rhinitis due to pollen: Secondary | ICD-10-CM | POA: Diagnosis not present

## 2017-05-07 DIAGNOSIS — Z114 Encounter for screening for human immunodeficiency virus [HIV]: Secondary | ICD-10-CM | POA: Diagnosis not present

## 2017-05-07 DIAGNOSIS — Z1211 Encounter for screening for malignant neoplasm of colon: Secondary | ICD-10-CM

## 2017-05-07 LAB — POCT URINALYSIS DIP (MANUAL ENTRY)
Bilirubin, UA: NEGATIVE
Glucose, UA: NEGATIVE mg/dL
Ketones, POC UA: NEGATIVE mg/dL
Leukocytes, UA: NEGATIVE
Nitrite, UA: NEGATIVE
Spec Grav, UA: 1.02 (ref 1.010–1.025)
Urobilinogen, UA: 0.2 E.U./dL
pH, UA: 6.5 (ref 5.0–8.0)

## 2017-05-07 MED ORDER — AMLODIPINE BESYLATE 5 MG PO TABS: ORAL_TABLET | ORAL | 3 refills | Status: DC

## 2017-05-07 NOTE — Progress Notes (Signed)
Subjective:    Patient ID: Tiffany Cline, female    DOB: 16-Dec-1960, 56 y.o.   MRN: [id number] PCP: Harrison Mons, PA-C Chief Complaint  Patient presents with  . Hypertension    bp check  . Medication Refill    amlodipine    HPI: 56 y/o F presents today for follow up on her hypertension. She is feeling good, says it is well controlled- no signs of worsening BP, like change in vision, chest pain, palpitations. She sees pulmonology 2x a year for reactive airway disease/ possible asthma. Her pulmonologist decreased her dose of Dulera, and she says that her chest tighter than it did with the higher dose.   Patient Active Problem List   Diagnosis Date Noted  . Mild intermittent asthma without complication 78/24/2353  . Abnormal CXR 04/12/2016  . Asthma, chronic 06/30/2014  . Essential hypertension 05/27/2013  . Allergic rhinitis 05/27/2013   Prior to Admission medications   Medication Sig Start Date End Date Taking? Authorizing Provider  albuterol (PROAIR HFA) 108 (90 Base) MCG/ACT inhaler INHALE TWO PUFFS BY MOUTH EVERY 4 HOURS AS NEEDED FOR WHEEZING, COUGH OR SHORTNESS OF BREATH 04/12/16  Yes Daub, Loura Back, MD  amLODipine (NORVASC) 5 MG tablet One tablet daily 05/07/17  Yes Jeffery, Chelle, PA-C  azelastine (ASTELIN) 0.1 % nasal spray Place 2 sprays into both nostrils 2 (two) times daily. Use in each nostril as directed 01/04/17  Yes Mannam, Praveen, MD  cyclobenzaprine (FLEXERIL) 10 MG tablet TAKE ONE TABLET BY MOUTH THREE TIMES DAILY AS NEEDED FOR MUSCLE SPASMS 07/24/16  Yes Jeffery, Chelle, PA-C  fluticasone (FLONASE) 50 MCG/ACT nasal spray USE TWO SPRAY(S) IN EACH NOSTRIL ONCE DAILY 04/12/16  Yes Daub, Loura Back, MD  GuaiFENesin (MUCINEX PO) Take by mouth.     Yes [provider]  mometasone-formoterol (DULERA) 100-5 MCG/ACT AERO Inhale 2 puffs into the lungs 2 (two) times daily. 01/04/17  Yes Mannam, Praveen, MD  montelukast (SINGULAIR) 10 MG tablet TAKE ONE TABLET BY MOUTH   DAILY AT BEDTIME 04/12/16  Yes Daub, Loura Back, MD  Norethindrone-Ethinyl Estradiol-Fe Biphas (LO LOESTRIN FE) 1 MG-10 MCG / 10 MCG tablet Take 1 tablet daily 09/19/16  Yes Young, Candiss Norse, NP  UNABLE TO FIND Nebulizer machine. Dx code J45.31 10/29/14  Yes [name], MD  mometasone-formoterol [facility]) 200-5 MCG/ACT AERO 2 puffs into the lungs twice a day 12/29/16   Harrison Mons, PA-C   Allergies  Allergen Reactions  . Septra [Sulfamethoxazole-Trimethoprim] Shortness Of Breath and Rash  . Latex Itching and Rash    Itching and redness with rash with use of latex gloves when she had blood drawn  . Sulfa Antibiotics Rash     Review of Systems  HENT: Positive for congestion, postnasal drip and sinus pressure.   Eyes: Negative for visual disturbance.  Respiratory: Positive for chest tightness. Negative for cough, shortness of breath and wheezing.   Cardiovascular: Negative for chest pain, palpitations and leg swelling.  Gastrointestinal: Negative.   Endocrine: Negative.   Musculoskeletal: Negative.   Skin: Negative.   Allergic/Immunologic: Positive for environmental allergies.  Neurological: Positive for headaches (assoc with menstrual cycle). Negative for dizziness, weakness and light-headedness.  Hematological: Negative.   Psychiatric/Behavioral: Negative.        Objective:   Physical Exam  Constitutional: She is oriented to person, place, and time. She appears well-developed and well-nourished. No distress.  BP 129/79 (BP Location: Right Arm, Patient Position: Sitting, Cuff Size: Normal)   Pulse 68  Temp 98.5 F (36.9 C) (Oral)   Resp 16   Ht 5' 3.5" (1.613 m)   Wt 139 lb 9.6 oz (63.3 kg)   SpO2 99%   BMI 24.34 kg/m    HENT:  Head: Normocephalic and atraumatic.  Eyes: Conjunctivae and EOM are normal. Pupils are equal, round, and reactive to light.  Neck: Normal range of motion. Neck supple. No thyromegaly present.  Cardiovascular: Normal rate, regular rhythm,  normal heart sounds and intact distal pulses.   Pulmonary/Chest: Effort normal and breath sounds normal.  Lymphadenopathy:    She has no cervical adenopathy.  Neurological: She is alert and oriented to person, place, and time.  Skin: Skin is warm and dry. She is not diaphoretic.  Psychiatric: She has a normal mood and affect. Her behavior is normal. Judgment and thought content normal.        Assessment & Plan:  1. Essential hypertension Condition stable, well controlled on current regimen. Continue taking Norvasc as prescribed. - TSH - T4, free - CBC - Comprehensive metabolic panel - Lipid panel - POCT urinalysis dipstick - amLODipine (NORVASC) 5 MG tablet; One tablet daily  Dispense: 90 tablet; Refill: 3  2. Seasonal allergic rhinitis due to pollen Continue using Flonase and azelastin as prescribed. Continue Dulera and Singulair as prescribed.  3. Screening for colon cancer Patient decline referral for colonoscopy and requested cologuard.  - Cologuard  4. Screening for HIV (human immunodeficiency virus) - HIV antibody  Follow up in 6 months for hypertension re-evaluation or as needed.

## 2017-05-07 NOTE — Patient Instructions (Signed)
     IF you received an x-ray today, you will receive an invoice from [facility]. Please contact [facility] at [phone] with questions or concerns regarding your invoice.   IF you received labwork today, you will receive an invoice from [facility]. Please contact [facility] at [phone] with questions or concerns regarding your invoice.   Our billing staff will not be able to assist you with questions regarding bills from these companies.  You will be contacted with the lab results as soon as they are available. The fastest way to get your results is to activate your My Chart account. Instructions are located on the last page of this paperwork. If you have not heard from us regarding the results in 2 weeks, please contact this office.     

## 2017-05-07 NOTE — Progress Notes (Signed)
Patient ID: Tiffany Cline, female    DOB: [birth year], 56 y.o.   MRN: [id number]  PCP: Patient, No Pcp Per  Chief Complaint  Patient presents with  . Hypertension    bp check  . Medication Refill    amlodipine    Subjective:   Presents for evaluation of HTN.  She feels good. Tolerating amlodipine well. Needs a refill. No CP, SOB, HA, dizziness.  Notes a recent REDUCTION in Dulera dose, with improvement in the chest tightness she was experiencing.  She is not fasting today. Has eaten an egg bagel   Review of Systems HENT: Positive for congestion, postnasal drip and sinus pressure.   Eyes: Negative for visual disturbance.  Respiratory: Positive for chest tightness. Negative for cough, shortness of breath and wheezing.   Cardiovascular: Negative for chest pain, palpitations and leg swelling.  Gastrointestinal: Negative.   Endocrine: Negative.   Musculoskeletal: Negative.   Skin: Negative.   Allergic/Immunologic: Positive for environmental allergies.  Neurological: Positive for headaches (assoc with menstrual cycle). Negative for dizziness, weakness and light-headedness.  Hematological: Negative.   Psychiatric/Behavioral: Negative.      Patient Active Problem List   Diagnosis Date Noted  . Mild intermittent asthma without complication 05/26/1600  . Abnormal CXR 04/12/2016  . Asthma, chronic 06/30/2014  . Essential hypertension 05/27/2013  . Allergic rhinitis 05/27/2013     Prior to Admission medications   Medication Sig Start Date End Date Taking? Authorizing Provider  albuterol (PROAIR HFA) 108 (90 Base) MCG/ACT inhaler INHALE TWO PUFFS BY MOUTH EVERY 4 HOURS AS NEEDED FOR WHEEZING, COUGH OR SHORTNESS OF BREATH 04/12/16  Yes Daub, Loura Back, MD  amLODipine (NORVASC) 5 MG tablet One tablet daily 04/12/16  Yes Daub, Loura Back, MD  azelastine (ASTELIN) 0.1 % nasal spray Place 2 sprays into both nostrils 2 (two) times daily. Use in each nostril as directed 01/04/17  Yes  Mannam, Praveen, MD  cyclobenzaprine (FLEXERIL) 10 MG tablet TAKE ONE TABLET BY MOUTH THREE TIMES DAILY AS NEEDED FOR MUSCLE SPASMS 07/24/16  Yes Nick Stults, PA-C  fluticasone (FLONASE) 50 MCG/ACT nasal spray USE TWO SPRAY(S) IN EACH NOSTRIL ONCE DAILY 04/12/16  Yes Daub, Loura Back, MD  GuaiFENesin (MUCINEX PO) Take by mouth.     Yes [provider]  mometasone-formoterol (DULERA) 100-5 MCG/ACT AERO Inhale 2 puffs into the lungs 2 (two) times daily. 01/04/17  Yes Mannam, Praveen, MD  montelukast (SINGULAIR) 10 MG tablet TAKE ONE TABLET BY MOUTH  DAILY AT BEDTIME 04/12/16  Yes Daub, Loura Back, MD  Norethindrone-Ethinyl Estradiol-Fe Biphas (LO LOESTRIN FE) 1 MG-10 MCG / 10 MCG tablet Take 1 tablet daily 09/19/16  Yes Young, Candiss Norse, NP  UNABLE TO FIND Nebulizer machine. Dx code J45.31 10/29/14  Yes [name], MD  mometasone-formoterol [facility]) 200-5 MCG/ACT AERO 2 puffs into the lungs twice a day 12/29/16   Harrison Mons, PA-C     Allergies  Allergen Reactions  . Septra [Sulfamethoxazole-Trimethoprim] Shortness Of Breath and Rash  . Latex Itching and Rash    Itching and redness with rash with use of latex gloves when she had blood drawn  . Sulfa Antibiotics Rash       Objective:  Physical Exam  Constitutional: She is oriented to person, place, and time. She appears well-developed and well-nourished. She is active and cooperative. No distress.  BP 129/79 (BP Location: Right Arm, Patient Position: Sitting, Cuff Size: Normal)   Pulse 68   Temp 98.5 F (36.9  C) (Oral)   Resp 16   Ht 5' 3.5" (1.613 m)   Wt 139 lb 9.6 oz (63.3 kg)   SpO2 99%   BMI 24.34 kg/m   HENT:  Head: Normocephalic and atraumatic.  Right Ear: Hearing normal.  Left Ear: Hearing normal.  Eyes: Conjunctivae are normal. No scleral icterus.  Neck: Normal range of motion. Neck supple. No thyromegaly present.  Cardiovascular: Normal rate, regular rhythm and normal heart sounds.   Pulses:      Radial  pulses are 2+ on the right side, and 2+ on the left side.  Pulmonary/Chest: Effort normal and breath sounds normal.  Lymphadenopathy:       Head (right side): No tonsillar, no preauricular, no posterior auricular and no occipital adenopathy present.       Head (left side): No tonsillar, no preauricular, no posterior auricular and no occipital adenopathy present.    She has no cervical adenopathy.       Right: No supraclavicular adenopathy present.       Left: No supraclavicular adenopathy present.  Neurological: She is alert and oriented to person, place, and time. No sensory deficit.  Skin: Skin is warm, dry and intact. No rash noted. No cyanosis or erythema. Nails show no clubbing.  Psychiatric: She has a normal mood and affect. Her speech is normal and behavior is normal.           Assessment & Plan:   Problem List Items Addressed This Visit    Essential hypertension - Primary    Controlled. Update labs. Continue current treatment.      Relevant Medications   amLODipine (NORVASC) 5 MG tablet   Other Relevant Orders   TSH (Completed)   T4, free (Completed)   CBC (Completed)   Comprehensive metabolic panel (Completed)   Lipid panel (Completed)   POCT urinalysis dipstick (Completed)   Allergic rhinitis    Continue current treatment.       Other Visit Diagnoses    Screening for colon cancer       Relevant Orders   Cologuard   Screening for HIV (human immunodeficiency virus)       Relevant Orders   HIV antibody (Completed)       No Follow-up on file.   Fara Chute, PA-C [facility] [place]

## 2017-05-08 LAB — CBC
Hematocrit: 42.9 % (ref 34.0–46.6)
Hemoglobin: 14.4 g/dL (ref 11.1–15.9)
MCH: 29.1 pg (ref 26.6–33.0)
MCHC: 33.6 g/dL (ref 31.5–35.7)
MCV: 87 fL (ref 79–97)
Platelets: 277 10*3/uL (ref 150–379)
RBC: 4.94 x10E6/uL (ref 3.77–5.28)
RDW: 14.3 % (ref 12.3–15.4)
WBC: 6.9 10*3/uL (ref 3.4–10.8)

## 2017-05-08 LAB — T4, FREE: Free T4: 1.15 ng/dL (ref 0.82–1.77)

## 2017-05-08 LAB — COMPREHENSIVE METABOLIC PANEL
ALT: 17 IU/L (ref 0–32)
AST: 15 IU/L (ref 0–40)
Albumin/Globulin Ratio: 1.4 (ref 1.2–2.2)
Albumin: 4 g/dL (ref 3.5–5.5)
Alkaline Phosphatase: 56 IU/L (ref 39–117)
BUN/Creatinine Ratio: 14 (ref 9–23)
BUN: 12 mg/dL (ref 6–24)
Bilirubin Total: 0.3 mg/dL (ref 0.0–1.2)
CO2: 25 mmol/L (ref 20–29)
Calcium: 8.9 mg/dL (ref 8.7–10.2)
Chloride: 102 mmol/L (ref 96–106)
Creatinine, Ser: 0.83 mg/dL (ref 0.57–1.00)
GFR calc Af Amer: 92 mL/min/{1.73_m2} (ref >59)
GFR calc non Af Amer: 80 mL/min/{1.73_m2} (ref >59)
Globulin, Total: 2.8 g/dL (ref 1.5–4.5)
Glucose: 90 mg/dL (ref 65–99)
Potassium: 3.5 mmol/L (ref 3.5–5.2)
Sodium: 141 mmol/L (ref 134–144)
Total Protein: 6.8 g/dL (ref 6.0–8.5)

## 2017-05-08 LAB — HIV ANTIBODY (ROUTINE TESTING W REFLEX): HIV Screen 4th Generation wRfx: NONREACTIVE

## 2017-05-08 LAB — LIPID PANEL
Chol/HDL Ratio: 3 ratio (ref 0.0–4.4)
Cholesterol, Total: 189 mg/dL (ref 100–199)
HDL: 64 mg/dL (ref >39)
LDL Calculated: 111 mg/dL — ABNORMAL HIGH (ref 0–99)
Triglycerides: 68 mg/dL (ref 0–149)
VLDL Cholesterol Cal: 14 mg/dL (ref 5–40)

## 2017-05-08 LAB — TSH: TSH: 0.797 u[IU]/mL (ref 0.450–4.500)

## 2017-05-26 ENCOUNTER — Other Ambulatory Visit: Payer: Self-pay | Admitting: Emergency Medicine

## 2017-05-26 DIAGNOSIS — J4541 Moderate persistent asthma with (acute) exacerbation: Secondary | ICD-10-CM

## 2017-06-02 NOTE — Assessment & Plan Note (Signed)
Controlled. Update labs. Continue current treatment.

## 2017-06-02 NOTE — Assessment & Plan Note (Signed)
Continue current treatment. 

## 2017-06-08 ENCOUNTER — Other Ambulatory Visit: Payer: Self-pay | Admitting: Physician Assistant

## 2017-06-08 DIAGNOSIS — R195 Other fecal abnormalities: Secondary | ICD-10-CM

## 2017-06-08 LAB — COLOGUARD: Cologuard: POSITIVE

## 2017-06-13 NOTE — Progress Notes (Addendum)
Referred. Referral appointment not yet scheduled. Will inquire with office staff.

## 2017-06-27 ENCOUNTER — Telehealth: Payer: Self-pay | Admitting: Physician Assistant

## 2017-06-27 NOTE — Telephone Encounter (Signed)
Letter created and will be sent to patient.

## 2017-06-27 NOTE — Telephone Encounter (Signed)
Unable to reach letter created and will be sent to pt.

## 2017-06-27 NOTE — Telephone Encounter (Signed)
Pt has referral placed for Gastroenterology for positive cologuard and referral was sent to [facility]. They have tried contacting the pt to schedule but have been unable to reach pt to schedule. I have called pt twice on both of her phone numbers and left a message to have her schedule with [place] or call us with any questions regarding this. Pt has not returned my calls and I do not see appt scheduled in Epic with [facility]. I spoke with Chelle concerning this and she asked that an unable to contact pt letter be mailed to pt. Can we write letter and mail to pt? Thanks!

## 2017-07-09 ENCOUNTER — Encounter: Payer: Self-pay | Admitting: Physician Assistant

## 2017-07-11 ENCOUNTER — Other Ambulatory Visit: Payer: Self-pay | Admitting: Emergency Medicine

## 2017-07-19 ENCOUNTER — Other Ambulatory Visit: Payer: Self-pay | Admitting: Emergency Medicine

## 2017-07-19 DIAGNOSIS — J4541 Moderate persistent asthma with (acute) exacerbation: Secondary | ICD-10-CM

## 2017-07-31 ENCOUNTER — Ambulatory Visit (INDEPENDENT_AMBULATORY_CARE_PROVIDER_SITE_OTHER): Payer: BLUE CROSS/BLUE SHIELD | Admitting: Pulmonary Disease

## 2017-07-31 ENCOUNTER — Encounter: Payer: Self-pay | Admitting: Pulmonary Disease

## 2017-07-31 VITALS — BP 130/80 | HR 83 | Ht 64.0 in | Wt 142.4 lb

## 2017-07-31 DIAGNOSIS — J4540 Moderate persistent asthma, uncomplicated: Secondary | ICD-10-CM

## 2017-07-31 DIAGNOSIS — J4531 Mild persistent asthma with (acute) exacerbation: Secondary | ICD-10-CM

## 2017-07-31 LAB — NITRIC OXIDE: Nitric Oxide: 17

## 2017-07-31 MED ORDER — MOMETASONE FURO-FORMOTEROL FUM 200-5 MCG/ACT IN AERO: 2.0000 | INHALATION_SPRAY | Freq: Two times a day (BID) | RESPIRATORY_TRACT | 5 refills | Status: DC

## 2017-07-31 NOTE — Patient Instructions (Signed)
We will resume the Dulera 200/50. We'll send in a prescription. Continue using the albuterol  Follow-up in 6 months

## 2017-07-31 NOTE — Progress Notes (Signed)
Tiffany Cline    [id number]    [birth year]  Primary Care Physician:Jeffery, Lanette Hampshire  Referring Physician: Harrison Mons, PA-C [place], [place] [address]  Chief complaint:  Follow up for Mild persistent asthma  HPI: Tiffany Cline is a 56 year old with past medical history of asthma, hypertension. She was diagnosed around 2011 and has been maintained on Advair and more recently [facility]. She has good control for most year but does have worsening of symptoms in wintertime. She has seasonal allergies, occasional postnasal drip. She denies any heartburn symptoms. She is allergic to cats. She has a dog at home but is not sensitive to it. 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  Interim History: Dulera switched down to 100 last visit since she is doing well. However after this change she reports worsening dyspnea with wheezing.                                                                                                                                  Outpatient Encounter Prescriptions as of 07/31/2017  Medication Sig  . amLODipine (NORVASC) 5 MG tablet One tablet daily  . azelastine (ASTELIN) 0.1 % nasal spray Place 2 sprays into both nostrils 2 (two) times daily. Use in each nostril as directed  . cyclobenzaprine (FLEXERIL) 10 MG tablet TAKE ONE TABLET BY MOUTH THREE TIMES DAILY AS NEEDED FOR MUSCLE SPASMS  . fluticasone (FLONASE) 50 MCG/ACT nasal spray USE TWO SPRAY(S) IN EACH NOSTRIL ONCE DAILY  . GuaiFENesin (MUCINEX PO) Take by mouth.    . mometasone-formoterol (DULERA) 100-5 MCG/ACT AERO Inhale 2 puffs into the lungs 2 (two) times daily.  . mometasone-formoterol (DULERA) 200-5 MCG/ACT AERO 2 puffs into the lungs twice a day  . montelukast (SINGULAIR) 10 MG tablet TAKE ONE TABLET BY MOUTH ONCE DAILY AT BEDTIME  . Norethindrone-Ethinyl Estradiol-Fe Biphas (LO LOESTRIN FE) 1 MG-10 MCG / 10 MCG tablet Take 1 tablet  daily  . PROAIR HFA 108 (90 Base) MCG/ACT inhaler INHALE TWO PUFFS BY MOUTH EVERY 4 HOURS AS NEEDED FOR WHEEZING, COUGH OR SHORTNESS OF BREATH  . UNABLE TO FIND Nebulizer machine. Dx code J45.31   Facility-Administered Encounter Medications as of 07/31/2017  Medication  . ipratropium (ATROVENT) nebulizer solution 0.5 mg    Allergies as of 07/31/2017 - Review Complete 07/31/2017  Allergen Reaction Noted  . Septra [sulfamethoxazole-trimethoprim] Shortness Of Breath and Rash 12/03/2012  . Latex Itching and Rash 08/25/2015  . Sulfa antibiotics Rash 04/11/2011    Past Medical History:  Diagnosis Date  . Asthma    induced by cold and allergies  . Leiomyoma     Past Surgical History:  Procedure Laterality Date  . MYOMECTOMY  2001    Family History  Problem Relation Age of Onset  . Diabetes Mother   . Hypertension Mother   .  Hypertension Father   . Cancer Father        prostate  . Thyroid disease Father        benign tumor  . Heart disease Maternal Grandmother   . Hypertension Paternal Grandmother   . Asthma Paternal Grandfather     Social History   Social History  . Marital status: Single    Spouse name: n/a  . Number of children: 0  . Years of education: RPh   Occupational History  . Pharmacist    Social History Main Topics  . Smoking status: Never Smoker  . Smokeless tobacco: Never Used  . Alcohol use No  . Drug use: No  . Sexual activity: Yes    Birth control/ protection: Pill   Other Topics Concern  . Not on file   Social History Narrative   Lives alone with her pets.   Family lives nearby.    Review of systems: Review of Systems  Constitutional: Negative for fever and chills.  HENT: Negative.   Eyes: Negative for blurred vision.  Respiratory: as per HPI  Cardiovascular: Negative for chest pain and palpitations.  Gastrointestinal: Negative for vomiting, diarrhea, blood per rectum. Genitourinary: Negative for dysuria, urgency, frequency and  hematuria.  Musculoskeletal: Negative for myalgias, back pain and joint pain.  Skin: Negative for itching and rash.  Neurological: Negative for dizziness, tremors, focal weakness, seizures and loss of consciousness.  Endo/Heme/Allergies: Negative for environmental allergies.  Psychiatric/Behavioral: Negative for depression, suicidal ideas and hallucinations.  All other systems reviewed and are negative.  Physical Exam: Blood pressure 130/80, pulse 83, height 5\' 4"  (1.626 m), weight 142 lb 6.4 oz (64.6 kg), SpO2 99 %. Gen:      No acute distress HEENT:  EOMI, sclera anicteric Neck:     No masses; no thyromegaly Lungs:    Clear to auscultation bilaterally; normal respiratory effort CV:         Regular rate and rhythm; no murmurs Abd:      + bowel sounds; soft, non-tender; no palpable masses, no distension Ext:    No edema; adequate peripheral perfusion Skin:      Warm and dry; no rash Neuro: alert and oriented x 3 Psych: normal mood and affect  Data Reviewed: CXR 04/12/16- Mild rt apical scarring. Images reviewed FENO 06/15/16-10  PFTs 06/02/15 FVC 3.02 [110%) FEV1 2.51 (115%) F/F 83 Normal spirometry  CBC with diff 06/15/16. WBC 5.9, absolute eosinophil count- 112 Blood allergy profile- 06/15/16- Negative, IgE 42  Assessment:  Mild persistent asthma She has not tolerated a down titration in Dulera inhaler. Since she has worsening symptoms we will increase the dose back to 200/50. She'll continue on albuterol inhaler as needed  Sinusitiis, Allergic rhinitis Treating with over-the-counter Claritin, astelin and netipot  Abnormal CXR Chest x-ray shows mild apical scarring which is not different from 2011. We'll continue to monitor. Further evaluated with a CT scan was discussed at last visit but she declined.  Plan/Recommendations: - Increase dulera to 200/50 - Continue astelin, claritin and netipot  Return in 6 months  Marshell Garfinkel MD [facility] Pager [phone] 07/31/2017, 11:56 AM  CC: Harrison Mons, PA-C

## 2017-08-07 ENCOUNTER — Other Ambulatory Visit: Payer: Self-pay | Admitting: Gynecology

## 2017-08-07 DIAGNOSIS — Z1231 Encounter for screening mammogram for malignant neoplasm of breast: Secondary | ICD-10-CM

## 2017-08-16 ENCOUNTER — Ambulatory Visit
Admission: RE | Admit: 2017-08-16 | Discharge: 2017-08-16 | Disposition: A | Discharge status: Home / Self Care | Payer: BLUE CROSS/BLUE SHIELD | Source: Ambulatory Visit | Attending: Gynecology | Admitting: Gynecology

## 2017-08-16 DIAGNOSIS — Z1231 Encounter for screening mammogram for malignant neoplasm of breast: Secondary | ICD-10-CM

## 2017-10-17 ENCOUNTER — Encounter: Payer: Self-pay | Admitting: Women's Health

## 2017-10-17 ENCOUNTER — Ambulatory Visit (INDEPENDENT_AMBULATORY_CARE_PROVIDER_SITE_OTHER): Payer: BLUE CROSS/BLUE SHIELD | Admitting: Women's Health

## 2017-10-17 VITALS — BP 122/80 | Ht 64.0 in | Wt 146.0 lb

## 2017-10-17 DIAGNOSIS — Z01419 Encounter for gynecological examination (general) (routine) without abnormal findings: Secondary | ICD-10-CM | POA: Diagnosis not present

## 2017-10-17 DIAGNOSIS — Z3041 Encounter for surveillance of contraceptive pills: Secondary | ICD-10-CM

## 2017-10-17 MED ORDER — NORETHIN-ETH ESTRAD-FE BIPHAS 1 MG-10 MCG / 10 MCG PO TABS: ORAL_TABLET | ORAL | 4 refills | Status: DC

## 2017-10-17 NOTE — Progress Notes (Signed)
Tiffany Cline December 03, 1960 [id number]    History:    Presents for annual exam.  Light monthly cycle on Lo Loestrin, no migraines, has had problems with migraines on other OCs and when off OC's. Mother late 71s for menopause. Not sexually active. Denies need for STD screen. Normal Pap and mammogram history. Had a negative Cologard., no screening colonoscopy. 2000 myomectomy, last ultrasound 2016 numerous 3 cm fibroids.   Past medical history, past surgical history, family history and social history were all reviewed and documented in the EPIC chart. 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  ROS:  A ROS was performed and pertinent positives and negatives are included.  Exam:  Vitals:   10/17/17 0846  BP: 122/80  Weight: 146 lb (66.2 kg)  Height: 5\' 4"  (1.626 m)   Body mass index is 25.06 kg/m.   General appearance:  Normal Thyroid:  Symmetrical, normal in size, without palpable masses or nodularity. Respiratory  Auscultation:  Clear without wheezing or rhonchi Cardiovascular  Auscultation:  Regular rate, without rubs, murmurs or gallops  Edema/varicosities:  Not grossly evident Abdominal  Soft,nontender, without masses, guarding or rebound.  Liver/spleen:  No organomegaly noted  Hernia:  None appreciated  Skin  Inspection:  Grossly normal   Breasts: Examined lying and sitting.     Right: Without masses, retractions, discharge or axillary adenopathy.     Left: Without masses, retractions, discharge or axillary adenopathy. Gentitourinary   Inguinal/mons:  Normal without inguinal adenopathy  External genitalia:  Normal  BUS/Urethra/Skene's glands:  Normal  Vagina:  Normal  Cervix:  Normal  Uterus:  Enlarged, 12 week size bulky/fibroids,   Midline and mobile  Adnexa/parametria:     Rt: Without masses or tenderness.   Lt: Without masses or tenderness.  Anus and perineum: Normal  Digital rectal exam: Normal sphincter  tone without palpated masses or tenderness  Assessment/Plan:  56 y.o. SBF G0  for annual exam with no complaints.  Light monthly cycle on Lo Loestrin Fibroid uterus History of migraines and seasonal allergies Hypertension-primary care manages meds and labs.  Plan: Lo Loestrin prescription, proper use, slight risk for blood clots and strokes, reviewed hazards with hypertension. Desires to continue. Will check [place] at placebo week, instructed to return to office placebo week at next annual. Condoms encouraged if sexually active. SBE's, continue annual screening mammogram, calcium rich diet, vitamin D 1000 daily encouraged. Encouraged to continue regular exercise, healthy lifestyle. Strongly encouraged screening colonoscopy, [facility] information given instructed to schedule. Pap normal 2016, new screening guidelines reviewed.    [place], 9:46 AM 10/17/2017

## 2017-10-17 NOTE — Patient Instructions (Signed)
Colonoscopy Dr Carlean Purl  [place] Maintenance, Female Adopting a healthy lifestyle and getting preventive care can go a long way to promote health and wellness. Talk with your health care provider about what schedule of regular examinations is right for you. This is a good chance for you to check in with your provider about disease prevention and staying healthy. In between checkups, there are plenty of things you can do on your own. Experts have done a lot of research about which lifestyle changes and preventive measures are most likely to keep you healthy. Ask your health care provider for more information. Weight and diet Eat a healthy diet  Be sure to include plenty of vegetables, fruits, low-fat dairy products, and lean protein.  Do not eat a lot of foods high in solid fats, added sugars, or salt.  Get regular exercise. This is one of the most important things you can do for your health. ? Most adults should exercise for at least 150 minutes each week. The exercise should increase your heart rate and make you sweat (moderate-intensity exercise). ? Most adults should also do strengthening exercises at least twice a week. This is in addition to the moderate-intensity exercise.  Maintain a healthy weight  Body mass index (BMI) is a measurement that can be used to identify possible weight problems. It estimates body fat based on height and weight. Your health care provider can help determine your BMI and help you achieve or maintain a healthy weight.  For females 44 years of age and older: ? A BMI below 18.5 is considered underweight. ? A BMI of 18.5 to 24.9 is normal. ? A BMI of 25 to 29.9 is considered overweight. ? A BMI of 30 and above is considered obese.  Watch levels of cholesterol and blood lipids  You should start having your blood tested for lipids and cholesterol at 54 years of age, then have this test every 5 years.  You may need to have your cholesterol  levels checked more often if: ? Your lipid or cholesterol levels are high. ? You are older than 54 years of age. ? You are at high risk for heart disease.  Cancer screening Lung Cancer  Lung cancer screening is recommended for adults 32-42 years old who are at high risk for lung cancer because of a history of smoking.  A yearly low-dose CT scan of the lungs is recommended for people who: ? Currently smoke. ? Have quit within the past 15 years. ? Have at least a 30-pack-year history of smoking. A pack year is smoking an average of one pack of cigarettes a day for 1 year.  Yearly screening should continue until it has been 15 years since you quit.  Yearly screening should stop if you develop a health problem that would prevent you from having lung cancer treatment.  Breast Cancer  Practice breast self-awareness. This means understanding how your breasts normally appear and feel.  It also means doing regular breast self-exams. Let your health care provider know about any changes, no matter how small.  If you are in your 20s or 30s, you should have a clinical breast exam (CBE) by a health care provider every 1-3 years as part of a regular health exam.  If you are 9 or older, have a CBE every year. Also consider having a breast X-ray (mammogram) every year.  If you have a family history of breast cancer, talk to your health care provider about  genetic screening.  If you are at high risk for breast cancer, talk to your health care provider about having an MRI and a mammogram every year.  Breast cancer gene (BRCA) assessment is recommended for women who have family members with BRCA-related cancers. BRCA-related cancers include: ? Breast. ? Ovarian. ? Tubal. ? Peritoneal cancers.  Results of the assessment will determine the need for genetic counseling and BRCA1 and BRCA2 testing.  Cervical Cancer Your health care provider may recommend that you be screened regularly for cancer of  the pelvic organs (ovaries, uterus, and vagina). This screening involves a pelvic examination, including checking for microscopic changes to the surface of your cervix (Pap test). You may be encouraged to have this screening done every 3 years, beginning at age 98.  For women ages 40-65, health care providers may recommend pelvic exams and Pap testing every 3 years, or they may recommend the Pap and pelvic exam, combined with testing for human papilloma virus (HPV), every 5 years. Some types of HPV increase your risk of cervical cancer. Testing for HPV may also be done on women of any age with unclear Pap test results.  Other health care providers may not recommend any screening for nonpregnant women who are considered low risk for pelvic cancer and who do not have symptoms. Ask your health care provider if a screening pelvic exam is right for you.  If you have had past treatment for cervical cancer or a condition that could lead to cancer, you need Pap tests and screening for cancer for at least 20 years after your treatment. If Pap tests have been discontinued, your risk factors (such as having a new sexual partner) need to be reassessed to determine if screening should resume. Some women have medical problems that increase the chance of getting cervical cancer. In these cases, your health care provider may recommend more frequent screening and Pap tests.  Colorectal Cancer  This type of cancer can be detected and often prevented.  Routine colorectal cancer screening usually begins at 54 years of age and continues through 54 years of age.  Your health care provider may recommend screening at an earlier age if you have risk factors for colon cancer.  Your health care provider may also recommend using home test kits to check for hidden blood in the stool.  A small camera at the end of a tube can be used to examine your colon directly (sigmoidoscopy or colonoscopy). This is done to check for the  earliest forms of colorectal cancer.  Routine screening usually begins at age 28.  Direct examination of the colon should be repeated every 5-10 years through 54 years of age. However, you may need to be screened more often if early forms of precancerous polyps or small growths are found.  Skin Cancer  Check your skin from head to toe regularly.  Tell your health care provider about any new moles or changes in moles, especially if there is a change in a mole's shape or color.  Also tell your health care provider if you have a mole that is larger than the size of a pencil eraser.  Always use sunscreen. Apply sunscreen liberally and repeatedly throughout the day.  Protect yourself by wearing long sleeves, pants, a wide-brimmed hat, and sunglasses whenever you are outside.  Heart disease, diabetes, and high blood pressure  High blood pressure causes heart disease and increases the risk of stroke. High blood pressure is more likely to develop in: ? People  who have blood pressure in the high end of the normal range (130-139/85-89 mm Hg). ? People who are overweight or obese. ? People who are African American.  If you are 5-22 years of age, have your blood pressure checked every 3-5 years. If you are 69 years of age or older, have your blood pressure checked every year. You should have your blood pressure measured twice-once when you are at a hospital or clinic, and once when you are not at a hospital or clinic. Record the average of the two measurements. To check your blood pressure when you are not at a hospital or clinic, you can use: ? An automated blood pressure machine at a pharmacy. ? A home blood pressure monitor.  If you are between 29 years and 27 years old, ask your health care provider if you should take aspirin to prevent strokes.  Have regular diabetes screenings. This involves taking a blood sample to check your fasting blood sugar level. ? If you are at a normal weight and  have a low risk for diabetes, have this test once every three years after 54 years of age. ? If you are overweight and have a high risk for diabetes, consider being tested at a younger age or more often. Preventing infection Hepatitis B  If you have a higher risk for hepatitis B, you should be screened for this virus. You are considered at high risk for hepatitis B if: ? You were born in a country where hepatitis B is common. Ask your health care provider which countries are considered high risk. ? Your parents were born in a high-risk country, and you have not been immunized against hepatitis B (hepatitis B vaccine). ? You have HIV or AIDS. ? You use needles to inject street drugs. ? You live with someone who has hepatitis B. ? You have had sex with someone who has hepatitis B. ? You get hemodialysis treatment. ? You take certain medicines for conditions, including cancer, organ transplantation, and autoimmune conditions.  Hepatitis C  Blood testing is recommended for: ? Everyone born from [birth year] through 1963. ? Anyone with known risk factors for hepatitis C.  Sexually transmitted infections (STIs)  You should be screened for sexually transmitted infections (STIs) including gonorrhea and chlamydia if: ? You are sexually active and are younger than 54 years of age. ? You are older than 54 years of age and your health care provider tells you that you are at risk for this type of infection. ? Your sexual activity has changed since you were last screened and you are at an increased risk for chlamydia or gonorrhea. Ask your health care provider if you are at risk.  If you do not have HIV, but are at risk, it may be recommended that you take a prescription medicine daily to prevent HIV infection. This is called pre-exposure prophylaxis (PrEP). You are considered at risk if: ? You are sexually active and do not regularly use condoms or know the HIV status of your partner(s). ? You take drugs by  injection. ? You are sexually active with a partner who has HIV.  Talk with your health care provider about whether you are at high risk of being infected with HIV. If you choose to begin PrEP, you should first be tested for HIV. You should then be tested every 3 months for as long as you are taking PrEP. Pregnancy  If you are premenopausal and you may become pregnant, ask your health care  provider about preconception counseling.  If you may become pregnant, take 400 to 800 micrograms (mcg) of folic acid every day.  If you want to prevent pregnancy, talk to your health care provider about birth control (contraception). Osteoporosis and menopause  Osteoporosis is a disease in which the bones lose minerals and strength with aging. This can result in serious bone fractures. Your risk for osteoporosis can be identified using a bone density scan.  If you are 35 years of age or older, or if you are at risk for osteoporosis and fractures, ask your health care provider if you should be screened.  Ask your health care provider whether you should take a calcium or vitamin D supplement to lower your risk for osteoporosis.  Menopause may have certain physical symptoms and risks.  Hormone replacement therapy may reduce some of these symptoms and risks. Talk to your health care provider about whether hormone replacement therapy is right for you. Follow these instructions at home:  Schedule regular health, dental, and eye exams.  Stay current with your immunizations.  Do not use any tobacco products including cigarettes, chewing tobacco, or electronic cigarettes.  If you are pregnant, do not drink alcohol.  If you are breastfeeding, limit how much and how often you drink alcohol.  Limit alcohol intake to no more than 1 drink per day for nonpregnant women. One drink equals 12 ounces of beer, 5 ounces of wine, or 1 ounces of hard liquor.  Do not use street drugs.  Do not share needles.  Ask  your health care provider for help if you need support or information about quitting drugs.  Tell your health care provider if you often feel depressed.  Tell your health care provider if you have ever been abused or do not feel safe at home. This information is not intended to replace advice given to you by your health care provider. Make sure you discuss any questions you have with your health care provider. Document Released: 05/29/2011 Document Revised: 04/20/2016 Document Reviewed: 08/17/2015 Elsevier Interactive Patient Education  [employer].

## 2017-12-31 ENCOUNTER — Other Ambulatory Visit: Payer: Self-pay | Admitting: Pulmonary Disease

## 2018-01-21 ENCOUNTER — Other Ambulatory Visit: Payer: Self-pay | Admitting: Pulmonary Disease

## 2018-02-04 ENCOUNTER — Encounter: Payer: Self-pay | Admitting: Pulmonary Disease

## 2018-02-04 ENCOUNTER — Ambulatory Visit (INDEPENDENT_AMBULATORY_CARE_PROVIDER_SITE_OTHER): Payer: BLUE CROSS/BLUE SHIELD | Admitting: Pulmonary Disease

## 2018-02-04 VITALS — BP 132/80 | HR 68 | Ht 63.5 in | Wt 146.2 lb

## 2018-02-04 DIAGNOSIS — J4540 Moderate persistent asthma, uncomplicated: Secondary | ICD-10-CM | POA: Diagnosis not present

## 2018-02-04 NOTE — Patient Instructions (Signed)
Continue using inhalers as prescribed You can consider taking an antihistamine like chlorpheniramine at night before sleeping Continue Astelin nasal spray. Follow-up in 6 months.

## 2018-02-04 NOTE — Progress Notes (Signed)
Tiffany Cline    [id number]    1961/08/13  Primary Care Physician:Jeffery, Lanette Hampshire  Referring Physician: Harrison Mons, PA-C [employer], [place] [address]  Chief complaint:  Follow up for Mild persistent asthma  HPI: Tiffany Cline is a 57 year old with past medical history of asthma, hypertension. She was diagnosed around 2011 and has been maintained on Advair and more recently [facility]. She has good control for most year but does have worsening of symptoms in wintertime. She has seasonal allergies, occasional postnasal drip. She denies any heartburn symptoms. She is allergic to cats. She has a dog at home but is not sensitive to it. 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  Interim History: Doing well on Dulera 200.  Complains of some chest tightness on cold days.  Has not needed to use her albuterol rescue inhaler. Has some sinus congestion with postnasal drip and is waking up with a cough.                                                                                                                                 Outpatient Encounter Medications as of 02/04/2018  Medication Sig  . amLODipine (NORVASC) 5 MG tablet One tablet daily  . azelastine (ASTELIN) 0.1 % nasal spray INHALE 2 SPRAY(S) IN EACH NOSTRIL TWICE DAILY AS DIRECTED  . cyclobenzaprine (FLEXERIL) 10 MG tablet TAKE ONE TABLET BY MOUTH THREE TIMES DAILY AS NEEDED FOR MUSCLE SPASMS  . DULERA 200-5 MCG/ACT AERO INHALE 2 PUFFS BY MOUTH TWICE DAILY  . fluticasone (FLONASE) 50 MCG/ACT nasal spray USE TWO SPRAY(S) IN EACH NOSTRIL ONCE DAILY  . GuaiFENesin (MUCINEX PO) Take by mouth.    . montelukast (SINGULAIR) 10 MG tablet TAKE ONE TABLET BY MOUTH ONCE DAILY AT BEDTIME  . Norethindrone-Ethinyl Estradiol-Fe Biphas (LO LOESTRIN FE) 1 MG-10 MCG / 10 MCG tablet Take 1 tablet daily  . PROAIR HFA 108 (90 Base) MCG/ACT inhaler INHALE TWO PUFFS BY MOUTH EVERY 4  HOURS AS NEEDED FOR WHEEZING, COUGH OR SHORTNESS OF BREATH  . UNABLE TO FIND Nebulizer machine. Dx code J45.31  . [DISCONTINUED] mometasone-formoterol (DULERA) 200-5 MCG/ACT AERO 2 puffs into the lungs twice a day   Facility-Administered Encounter Medications as of 02/04/2018  Medication  . ipratropium (ATROVENT) nebulizer solution 0.5 mg    Allergies as of 02/04/2018 - Review Complete 02/04/2018  Allergen Reaction Noted  . Septra [sulfamethoxazole-trimethoprim] Shortness Of Breath and Rash 12/03/2012  . Latex Itching and Rash 08/25/2015  . Sulfa antibiotics Rash 04/11/2011    Past Medical History:  Diagnosis Date  . Asthma    induced by cold and allergies  . Leiomyoma     Past Surgical History:  Procedure Laterality Date  . MYOMECTOMY  2001    Family History  Problem Relation Age of Onset  . Diabetes Mother   .  Hypertension Mother   . Hypertension Father   . Cancer Father        prostate  . Thyroid disease Father        benign tumor  . Heart disease Maternal Grandmother   . Hypertension Paternal Grandmother   . Asthma Paternal Grandfather     Social History   Socioeconomic History  . Marital status: Single    Spouse name: n/a  . Number of children: 0  . Years of education: RPh  . Highest education level: Not on file  Social Needs  . Financial resource strain: Not on file  . Food insecurity - worry: Not on file  . Food insecurity - inability: Not on file  . Transportation needs - medical: Not on file  . Transportation needs - non-medical: Not on file  Occupational History  . Occupation: Pharmacist  Tobacco Use  . Smoking status: Never Smoker  . Smokeless tobacco: Never Used  Substance and Sexual Activity  . Alcohol use: No  . Drug use: No  . Sexual activity: Not Currently    Birth control/protection: Pill  Other Topics Concern  . Not on file  Social History Narrative   Lives alone with her pets.   Family lives nearby.    Review of  systems: Review of Systems  Constitutional: Negative for fever and chills.  HENT: Negative.   Eyes: Negative for blurred vision.  Respiratory: as per HPI  Cardiovascular: Negative for chest pain and palpitations.  Gastrointestinal: Negative for vomiting, diarrhea, blood per rectum. Genitourinary: Negative for dysuria, urgency, frequency and hematuria.  Musculoskeletal: Negative for myalgias, back pain and joint pain.  Skin: Negative for itching and rash.  Neurological: Negative for dizziness, tremors, focal weakness, seizures and loss of consciousness.  Endo/Heme/Allergies: Negative for environmental allergies.  Psychiatric/Behavioral: Negative for depression, suicidal ideas and hallucinations.  All other systems reviewed and are negative.  Physical Exam: Blood pressure 132/80, pulse 68, height 5' 3.5" (1.613 m), weight 146 lb 3.2 oz (66.3 kg), SpO2 99 %. Gen:      No acute distress HEENT:  EOMI, sclera anicteric Neck:     No masses; no thyromegaly Lungs:    Clear to auscultation bilaterally; normal respiratory effort CV:         Regular rate and rhythm; no murmurs Abd:      + bowel sounds; soft, non-tender; no palpable masses, no distension Ext:    No edema; adequate peripheral perfusion Skin:      Warm and dry; no rash Neuro: alert and oriented x 3 Psych: normal mood and affect  Data Reviewed: CXR 04/12/16- Mild rt apical scarring. Images reviewed FENO 06/15/16-10  PFTs 06/02/15 FVC 3.02 [110%), FEV1 2.51 (115%), F/F 83 Normal spirometry  CBC with diff 06/15/16. WBC 5.9, absolute eosinophil count- 112 Blood allergy profile- 06/15/16- Negative, IgE 42  Assessment:  Mild persistent asthma She has not tolerated a down titration in Dulera inhaler.  Symptoms are stable on a dose of 200/50. She'll continue on albuterol inhaler as needed  Sinusitiis, Allergic rhinitis Treating with over-the-counter Claritin, astelin and netipot Try chlorpheniramine at night before sleeping to  reduce the postnasal drip.  Abnormal CXR Chest x-ray shows mild apical scarring which is not different from 2011. We'll continue to monitor. Further evaluation with a CT scan was discussed at last visit but she declined.  Plan/Recommendations: - Continue dulera to 200/50 - Continue astelin, claritin and netipot. Try chlorpheniramine before bedtime  Return in 6 months  Kerstie Agent  MD [facility] Pager [phone] 02/04/2018, 9:59 AM  CC: Harrison Mons, PA-C

## 2018-03-04 ENCOUNTER — Encounter: Payer: Self-pay | Admitting: Physician Assistant

## 2018-03-13 ENCOUNTER — Encounter: Payer: Self-pay | Admitting: Physician Assistant

## 2018-03-13 ENCOUNTER — Other Ambulatory Visit: Payer: Self-pay

## 2018-03-13 ENCOUNTER — Ambulatory Visit (INDEPENDENT_AMBULATORY_CARE_PROVIDER_SITE_OTHER): Payer: BLUE CROSS/BLUE SHIELD | Admitting: Physician Assistant

## 2018-03-13 ENCOUNTER — Ambulatory Visit (INDEPENDENT_AMBULATORY_CARE_PROVIDER_SITE_OTHER): Payer: BLUE CROSS/BLUE SHIELD

## 2018-03-13 VITALS — BP 110/82 | HR 86 | Temp 98.7°F | Resp 16 | Ht 63.5 in | Wt 143.0 lb

## 2018-03-13 DIAGNOSIS — I10 Essential (primary) hypertension: Secondary | ICD-10-CM

## 2018-03-13 DIAGNOSIS — J301 Allergic rhinitis due to pollen: Secondary | ICD-10-CM

## 2018-03-13 DIAGNOSIS — N959 Unspecified menopausal and perimenopausal disorder: Secondary | ICD-10-CM | POA: Diagnosis not present

## 2018-03-13 DIAGNOSIS — R195 Other fecal abnormalities: Secondary | ICD-10-CM | POA: Diagnosis not present

## 2018-03-13 DIAGNOSIS — M79644 Pain in right finger(s): Secondary | ICD-10-CM | POA: Diagnosis not present

## 2018-03-13 DIAGNOSIS — Z0184 Encounter for antibody response examination: Secondary | ICD-10-CM

## 2018-03-13 NOTE — Assessment & Plan Note (Signed)
Re-referred to GI. Stressed the importance of colonoscopy, as positive Cologuard indicates increased risk of colorectal cancer. She agrees to go.

## 2018-03-13 NOTE — Progress Notes (Signed)
Patient ID: Tiffany Cline, female    DOB: [birth year], 57 y.o.   MRN: [id number]  PCP: Harrison Mons, PA-C  Chief Complaint  Patient presents with  . URI    nasal congestion,  yellowish mucus, HA, Nausea monday, vomitted once  for a few weeks,    . Hand Pain    dislocated it a month ago, it popped back in place last week but still sore  right pinky finger     Subjective:   Presents for evaluation of several concerns.  1. Increased allergies. Exacerbating asthma. She was doing well last month when she followed up with pulmonology. HA draiange and nausea x 2 days. Feels some better today. Still has some hoarseness. USes Neti pot, azelastine NS, loratadine, prn steroid nasal spray (has not started this season), montelukast, PRN short-acting oral decongestant, and PRN Vicks.  2. "I think I dislocated my finger". RIGHT 5th finger. About 1 month ago, while cleaning house, hyperextended it. The finger seemed to be sticking out to the side. About a week later, she was at work and heard a loud pop, and then it wasn't sticking out so much. The pain has improved, still has pain at the MCP with movement. She is RIGHT hand dominant.  3. Measles. Thinks she needs the titer. Recalls only one dose of the vaccine. 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  4. Desires FSH level. Her GYN has been following this, to decide when she will stop COC. She's in the inactive pills of her current pack, the ideal time to check, which never coordinates with her GYN visits. Not as menopausal as she has been, but continues to have monthly bleeding.  5. Positive Cologuard test in 2016. Did not go to GI when referred. GYN also recommended colonoscopy at her last visit there. She reports that she had to provide 3 samples before she got a result, so she thinks that the positive result is not accurate. No melena,  hematochezia.   Review of Systems As above.    Patient Active Problem List   Diagnosis Date Noted  . Positive colorectal cancer screening using Cologuard test 03/13/2018  . Mild intermittent asthma without complication 90/30/0923  . Abnormal CXR 04/12/2016  . Asthma, chronic 06/30/2014  . Essential hypertension 05/27/2013  . Allergic rhinitis 05/27/2013     Prior to Admission medications   Medication Sig Start Date End Date Taking? Authorizing Provider  amLODipine (NORVASC) 5 MG tablet One tablet daily 05/07/17  Yes Tawni Melkonian, PA-C  azelastine (ASTELIN) 0.1 % nasal spray INHALE 2 SPRAY(S) IN EACH NOSTRIL TWICE DAILY AS DIRECTED 01/21/18  Yes Mannam, Praveen, MD  cyclobenzaprine (FLEXERIL) 10 MG tablet TAKE ONE TABLET BY MOUTH THREE TIMES DAILY AS NEEDED FOR MUSCLE SPASMS 07/24/16  Yes Ethelyn Cerniglia, PA-C  DULERA 200-5 MCG/ACT AERO INHALE 2 PUFFS BY MOUTH TWICE DAILY 12/31/17  Yes Mannam, Praveen, MD  fluticasone (FLONASE) 50 MCG/ACT nasal spray USE TWO SPRAY(S) IN EACH NOSTRIL ONCE DAILY 04/12/16  Yes Daub, Loura Back, MD  GuaiFENesin (MUCINEX PO) Take by mouth.     Yes [provider]  montelukast (SINGULAIR) 10 MG tablet TAKE ONE TABLET BY MOUTH ONCE DAILY AT BEDTIME 05/28/17  Yes Alsha Meland, PA-C  Norethindrone-Ethinyl Estradiol-Fe Biphas (LO LOESTRIN FE) 1 MG-10 MCG / 10 MCG tablet Take 1 tablet daily 10/17/17  Yes [name], NP  PROAIR [facility]  108 (90 Base) MCG/ACT inhaler INHALE TWO PUFFS BY MOUTH EVERY 4 HOURS AS NEEDED FOR WHEEZING, COUGH OR SHORTNESS OF BREATH 07/23/17  Yes Malai Lady, PA-C  UNABLE TO FIND Nebulizer machine. Dx code J45.31 10/29/14  Yes [name], MD     Allergies  Allergen Reactions  . Septra [Sulfamethoxazole-Trimethoprim] Shortness Of Breath and Rash  . Latex Itching and Rash    Itching and redness with rash with use of latex gloves when she had blood drawn  . Sulfa Antibiotics Rash       Objective:  Physical Exam   Constitutional: She is oriented to person, place, and time. She appears well-developed and well-nourished. She is active and cooperative. No distress.  BP 110/82   Pulse 86   Temp 98.7 F (37.1 C)   Resp 16   Ht 5' 3.5" (1.613 m)   Wt 143 lb (64.9 kg)   SpO2 98%   BMI 24.93 kg/m   HENT:  Head: Normocephalic and atraumatic.  Right Ear: Hearing, tympanic membrane, external ear and ear canal normal.  Left Ear: Hearing, tympanic membrane, external ear and ear canal normal.  Nose: Mucosal edema (mild) present. No rhinorrhea, nose lacerations, sinus tenderness, nasal deformity, septal deviation or nasal septal hematoma. No epistaxis.  No foreign bodies.  Mouth/Throat: Uvula is midline, oropharynx is clear and moist and mucous membranes are normal.  Eyes: Conjunctivae and lids are normal. No scleral icterus.  Neck: Normal range of motion and phonation normal. Neck supple. No thyromegaly present.  Cardiovascular: Normal rate, regular rhythm and normal heart sounds.  Pulses:      Radial pulses are 2+ on the right side, and 2+ on the left side.  Pulmonary/Chest: Effort normal and breath sounds normal. She has no wheezes.  Musculoskeletal:       Right hand: She exhibits bony tenderness. She exhibits normal range of motion, no tenderness, normal capillary refill, no deformity, no laceration and no swelling. Normal sensation noted. Normal strength noted.       Hands: Lymphadenopathy:       Head (right side): No tonsillar, no preauricular, no posterior auricular and no occipital adenopathy present.       Head (left side): No tonsillar, no preauricular, no posterior auricular and no occipital adenopathy present.    She has no cervical adenopathy.       Right: No supraclavicular adenopathy present.       Left: No supraclavicular adenopathy present.  Neurological: She is alert and oriented to person, place, and time. No sensory deficit.  Skin: Skin is warm, dry and intact. No rash noted. No  cyanosis or erythema. Nails show no clubbing.  Psychiatric: She has a normal mood and affect. Her speech is normal and behavior is normal.       Assessment & Plan:   Problem List Items Addressed This Visit    Essential hypertension    Well controlled. Continue amlodipine 5 mg daily.      Allergic rhinitis - Primary    Not adequately controlled. Resume steroid NS. May increase loratadine to BID and use PRN decongestant while waiting for effect, which can take 7-14 days.      Positive colorectal cancer screening using Cologuard test    Re-referred to GI. Stressed the importance of colonoscopy, as positive Cologuard indicates increased risk of colorectal cancer. She agrees to go.      Relevant Orders   Ambulatory referral to Gastroenterology    Other Visit Diagnoses  Finger pain, right       Likely sprain associated with dislocation and spontaneous reduction. Buddy tape. May need hand surgery referral.   Relevant Orders   DG Finger Little Right (Completed)   Menopausal disorder       Update FSH/LH at her request. Will forward to GYN.   Relevant Orders   FSH/LH   Immunity status testing       Await titer. Booster MMR if titer indicates lack of immunity.   Relevant Orders   Measles/Mumps/Rubella Immunity       Return if symptoms worsen or fail to improve.   Fara Chute, PA-C [facility]

## 2018-03-13 NOTE — Patient Instructions (Addendum)
Restart the steroid nasal spray. INCREASE the Claritin to twice daily until the Flonase/Nasonex is working! You may need some doses of pseudofed in the meantime.    IF you received an x-ray today, you will receive an invoice from [facility]. Please contact [facility] at [phone] with questions or concerns regarding your invoice.   IF you received labwork today, you will receive an invoice from [facility]. Please contact [facility] at [phone] with questions or concerns regarding your invoice.   Our billing staff will not be able to assist you with questions regarding bills from these companies.  You will be contacted with the lab results as soon as they are available. The fastest way to get your results is to activate your My Chart account. Instructions are located on the last page of this paperwork. If you have not heard from [place] regarding the results in 2 weeks, please contact this office.

## 2018-03-13 NOTE — Assessment & Plan Note (Signed)
Not adequately controlled. Resume steroid NS. May increase loratadine to BID and use PRN decongestant while waiting for effect, which can take 7-14 days.

## 2018-03-13 NOTE — Assessment & Plan Note (Signed)
Well-controlled.  Continue amlodipine 5 mg daily 

## 2018-03-14 LAB — MEASLES/MUMPS/RUBELLA IMMUNITY
MUMPS ABS, IGG: >300 AU/mL (ref >10.9)
RUBEOLA AB, IGG: <25 AU/mL — ABNORMAL LOW (ref >29.9)
Rubella Antibodies, IGG: 21.6 index (ref >0.99)

## 2018-03-14 LAB — FSH/LH
FSH: 1.6 m[IU]/mL
LH: 0.3 m[IU]/mL

## 2018-03-16 ENCOUNTER — Telehealth: Payer: Self-pay | Admitting: Physician Assistant

## 2018-03-16 NOTE — Telephone Encounter (Signed)
Pt. Called to ask wether she should come in for an office visit for her measles tider or if she should come in as a fast track.

## 2018-03-18 NOTE — Telephone Encounter (Signed)
Please schedule pt for fast track nursing visit for MMR injection.

## 2018-03-19 ENCOUNTER — Ambulatory Visit (INDEPENDENT_AMBULATORY_CARE_PROVIDER_SITE_OTHER): Payer: BLUE CROSS/BLUE SHIELD | Admitting: Physician Assistant

## 2018-03-19 DIAGNOSIS — Z23 Encounter for immunization: Secondary | ICD-10-CM | POA: Diagnosis not present

## 2018-03-19 NOTE — Patient Instructions (Signed)
You received your MMR injection today. 

## 2018-03-19 NOTE — Telephone Encounter (Signed)
Will send pt a mychart message letting her know she doesn't need an OV and she can just walk in to get her MMR.

## 2018-03-19 NOTE — Progress Notes (Signed)
Pt here for MMR injection

## 2018-03-21 ENCOUNTER — Other Ambulatory Visit: Payer: Self-pay | Admitting: Pulmonary Disease

## 2018-03-21 ENCOUNTER — Other Ambulatory Visit: Payer: Self-pay | Admitting: Physician Assistant

## 2018-03-21 DIAGNOSIS — J4541 Moderate persistent asthma with (acute) exacerbation: Secondary | ICD-10-CM

## 2018-05-06 ENCOUNTER — Other Ambulatory Visit: Payer: Self-pay | Admitting: Physician Assistant

## 2018-05-06 DIAGNOSIS — I10 Essential (primary) hypertension: Secondary | ICD-10-CM

## 2018-05-06 DIAGNOSIS — J4541 Moderate persistent asthma with (acute) exacerbation: Secondary | ICD-10-CM

## 2018-05-07 ENCOUNTER — Telehealth: Payer: Self-pay | Admitting: Physician Assistant

## 2018-05-07 NOTE — Telephone Encounter (Signed)
Last refill #/16/19 for Norvasc  Last refill 07/23/18 for Proair   LOV  3/ 1/19 PCP< Harrison Mons, [place], [place]

## 2018-05-07 NOTE — Telephone Encounter (Signed)
This encounter was created in error - please disregard.

## 2018-05-14 ENCOUNTER — Encounter: Payer: Self-pay | Admitting: Physician Assistant

## 2018-06-09 ENCOUNTER — Other Ambulatory Visit: Payer: Self-pay | Admitting: Pulmonary Disease

## 2018-07-23 ENCOUNTER — Encounter: Payer: Self-pay | Admitting: Physician Assistant

## 2018-08-09 ENCOUNTER — Ambulatory Visit (INDEPENDENT_AMBULATORY_CARE_PROVIDER_SITE_OTHER)
Admission: RE | Admit: 2018-08-09 | Discharge: 2018-08-09 | Disposition: A | Discharge status: Home / Self Care | Payer: BLUE CROSS/BLUE SHIELD | Source: Ambulatory Visit | Attending: Pulmonary Disease | Admitting: Pulmonary Disease

## 2018-08-09 ENCOUNTER — Ambulatory Visit (INDEPENDENT_AMBULATORY_CARE_PROVIDER_SITE_OTHER): Payer: BLUE CROSS/BLUE SHIELD | Admitting: Pulmonary Disease

## 2018-08-09 ENCOUNTER — Encounter: Payer: Self-pay | Admitting: Pulmonary Disease

## 2018-08-09 VITALS — BP 120/72 | HR 71 | Ht 63.25 in | Wt 146.0 lb

## 2018-08-09 DIAGNOSIS — J4540 Moderate persistent asthma, uncomplicated: Secondary | ICD-10-CM

## 2018-08-09 NOTE — Progress Notes (Signed)
Samani Deal    [id number]    07-Mar-1961  Primary Care Physician:Patient, No Pcp Per  Referring Physician: No referring provider defined for this encounter.  Chief complaint:  Follow up for Moderate persistent asthma  HPI: Tiffany Cline is a 57 year old with past medical history of asthma, hypertension. She was diagnosed around 2011 and has been maintained on Advair and more recently [facility]. She has good control for most year but does have worsening of symptoms in wintertime. She has seasonal allergies, occasional postnasal drip. She denies any heartburn symptoms. She is allergic to cats. She has a dog at home but is not sensitive to it. 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  2018-Dulera reduced to 100 but had to go back up due to increased dyspnea, wheezing  Interim History: Continues on Dulera 200.  She had a minor exacerbation last week when she was exposed to bad weather.  Treated herself with increased use of albuterol Currently with improved symptoms.  Physical Exam: Blood pressure 120/72, pulse 71, height 5' 3.25" (1.607 m), weight 146 lb (66.2 kg), SpO2 99 %. Gen:      No acute distress HEENT:  EOMI, sclera anicteric Neck:     No masses; no thyromegaly Lungs:    Clear to auscultation bilaterally; normal respiratory effort CV:         Regular rate and rhythm; no murmurs Abd:      + bowel sounds; soft, non-tender; no palpable masses, no distension Ext:    No edema; adequate peripheral perfusion Skin:      Warm and dry; no rash Neuro: alert and oriented x 3 Psych: normal mood and affect  Data Reviewed: Imaging CXR 04/12/16- Mild rt apical scarring. Images reviewed  PFTs  06/02/15 FVC 3.02 [110%), FEV1 2.51 (115%), F/F 83 Normal spirometry  FENO 06/15/16-10  Labs CBC with diff 06/15/16. WBC 5.9, absolute eosinophil count- 112 Blood allergy profile- 06/15/16- Negative, IgE 42  Assessment:  Moderate  persistent asthma She has not tolerated a down titration in Dulera inhaler.  Symptoms are stable on a dose of 200/50. She'll continue on albuterol inhaler as needed She had a minor exacerbation last week but has recovered to baseline.  Sinusitiis, Allergic rhinitis Treating with over-the-counter Claritin, astelin and netipot  Abnormal CXR Chest x-ray shows mild apical scarring which is not different from 2011. We'll continue to monitor. Further evaluation with a CT scan was discussed at last visit but she declined. Repeat chest x-ray today  Plan/Recommendations: - Continue dulera to 200/50 - Continue astelin, claritin and netipot. - Chest x-ray  Return in 12 months                                                                                                                           Outpatient Encounter Medications as of 08/09/2018  Medication Sig  . amLODipine (NORVASC) 5  MG tablet TAKE 1 TABLET BY MOUTH DAILY  . azelastine (ASTELIN) 0.1 % nasal spray USE 2 SPRAY(S) IN EACH NOSTRIL TWICE DAILY AS DIRECTED  . cyclobenzaprine (FLEXERIL) 10 MG tablet TAKE ONE TABLET BY MOUTH THREE TIMES DAILY AS NEEDED FOR MUSCLE SPASMS  . DULERA 200-5 MCG/ACT AERO INHALE 2 PUFFS BY MOUTH TWICE DAILY  . fluticasone (FLONASE) 50 MCG/ACT nasal spray USE TWO SPRAY(S) IN EACH NOSTRIL ONCE DAILY  . GuaiFENesin (MUCINEX PO) Take by mouth.    . montelukast (SINGULAIR) 10 MG tablet TAKE 1 TABLET BY MOUTH ONCE DAILY AT BEDTIME  . Norethindrone-Ethinyl Estradiol-Fe Biphas (LO LOESTRIN FE) 1 MG-10 MCG / 10 MCG tablet Take 1 tablet daily  . PROAIR HFA 108 (90 Base) MCG/ACT inhaler INHALE 2 PUFFS BY MOUTH EVERY 4 HOURS AS NEEDED FOR WHEEZING, COUGH OR SHORTNESS OF BREATH  . UNABLE TO FIND Nebulizer machine. Dx code J45.31   Facility-Administered Encounter Medications as of 08/09/2018  Medication  . ipratropium (ATROVENT) nebulizer solution 0.5 mg    Allergies as of 08/09/2018 - Review Complete 08/09/2018    Allergen Reaction Noted  . Septra [sulfamethoxazole-trimethoprim] Shortness Of Breath and Rash 12/03/2012  . Latex Itching and Rash 08/25/2015  . Sulfa antibiotics Rash 04/11/2011    Past Medical History:  Diagnosis Date  . Asthma    induced by cold and allergies  . Leiomyoma     Past Surgical History:  Procedure Laterality Date  . MYOMECTOMY  2001    Family History  Problem Relation Age of Onset  . Diabetes Mother   . Hypertension Mother   . Hypertension Father   . Cancer Father        prostate  . Thyroid disease Father        benign tumor  . Heart disease Maternal Grandmother   . Hypertension Paternal Grandmother   . Asthma Paternal Grandfather     Social History   Socioeconomic History  . Marital status: Single    Spouse name: n/a  . Number of children: 0  . Years of education: RPh  . Highest education level: Not on file  Occupational History  . Occupation: Software engineer  Social Needs  . Financial resource strain: Not on file  . Food insecurity:    Worry: Not on file    Inability: Not on file  . Transportation needs:    Medical: Not on file    Non-medical: Not on file  Tobacco Use  . Smoking status: Never Smoker  . Smokeless tobacco: Never Used  Substance and Sexual Activity  . Alcohol use: No  . Drug use: No  . Sexual activity: Not Currently    Birth control/protection: Pill  Lifestyle  . Physical activity:    Days per week: Not on file    Minutes per session: Not on file  . Stress: Not on file  Relationships  . Social connections:    Talks on phone: Not on file    Gets together: Not on file    Attends religious service: Not on file    Active member of club or organization: Not on file    Attends meetings of clubs or organizations: Not on file    Relationship status: Not on file  . Intimate partner violence:    Fear of current or ex partner: Not on file    Emotionally abused: Not on file    Physically abused: Not on file    Forced sexual  activity: Not on file  Other Topics  Concern  . Not on file  Social History Narrative   Lives alone with her pets.   Family lives nearby.   Review of systems: Review of Systems  Constitutional: Negative for fever and chills.  HENT: Negative.   Eyes: Negative for blurred vision.  Respiratory: as per HPI  Cardiovascular: Negative for chest pain and palpitations.  Gastrointestinal: Negative for vomiting, diarrhea, blood per rectum. Genitourinary: Negative for dysuria, urgency, frequency and hematuria.  Musculoskeletal: Negative for myalgias, back pain and joint pain.  Skin: Negative for itching and rash.  Neurological: Negative for dizziness, tremors, focal weakness, seizures and loss of consciousness.  Endo/Heme/Allergies: Negative for environmental allergies.  Psychiatric/Behavioral: Negative for depression, suicidal ideas and hallucinations.  All other systems reviewed and are negative.  Marshell Garfinkel MD [facility] 08/09/2018, 9:50 AM  CC: No ref. provider found

## 2018-08-09 NOTE — Patient Instructions (Signed)
Continue using the [facility] and albuterol as needed We will get a chest x-ray today to monitor the scarring Follow-up in 1 year with nurse practitioner.

## 2018-08-10 ENCOUNTER — Other Ambulatory Visit: Payer: Self-pay | Admitting: Pulmonary Disease

## 2018-08-21 NOTE — Progress Notes (Signed)
LMTCB

## 2018-10-08 ENCOUNTER — Other Ambulatory Visit: Payer: Self-pay | Admitting: Gynecology

## 2018-10-08 DIAGNOSIS — Z1231 Encounter for screening mammogram for malignant neoplasm of breast: Secondary | ICD-10-CM

## 2018-11-05 ENCOUNTER — Other Ambulatory Visit: Payer: Self-pay | Admitting: Pulmonary Disease

## 2018-11-19 ENCOUNTER — Ambulatory Visit
Admission: RE | Admit: 2018-11-19 | Discharge: 2018-11-19 | Disposition: A | Discharge status: Home / Self Care | Payer: BLUE CROSS/BLUE SHIELD | Source: Ambulatory Visit | Attending: Gynecology | Admitting: Gynecology

## 2018-11-19 DIAGNOSIS — Z1231 Encounter for screening mammogram for malignant neoplasm of breast: Secondary | ICD-10-CM

## 2018-12-09 ENCOUNTER — Ambulatory Visit (INDEPENDENT_AMBULATORY_CARE_PROVIDER_SITE_OTHER): Payer: BLUE CROSS/BLUE SHIELD | Admitting: Women's Health

## 2018-12-09 ENCOUNTER — Encounter: Payer: Self-pay | Admitting: Women's Health

## 2018-12-09 VITALS — BP 120/78 | Ht 63.0 in | Wt 146.0 lb

## 2018-12-09 DIAGNOSIS — Z01419 Encounter for gynecological examination (general) (routine) without abnormal findings: Secondary | ICD-10-CM

## 2018-12-09 DIAGNOSIS — Z3041 Encounter for surveillance of contraceptive pills: Secondary | ICD-10-CM

## 2018-12-09 DIAGNOSIS — N951 Menopausal and female climacteric states: Secondary | ICD-10-CM

## 2018-12-09 MED ORDER — NORETHIN-ETH ESTRAD-FE BIPHAS 1 MG-10 MCG / 10 MCG PO TABS
ORAL_TABLET | ORAL | 4 refills | Status: DC
Start: 2018-12-09 — End: 2019-12-22

## 2018-12-09 NOTE — Progress Notes (Signed)
Tiffany Cline 1961-10-24 [id number]    History:    Presents for annual exam.  Light 3 days cycle on lo Loestrin without complaint.  Normal Pap and mammogram history.  Not sexually active, denies need for STD screen.  Occasional hot flushes.  Primary care manages hypertension and asthma.  2000 myomectomy.  Negative Cologuard has not had a screening colonoscopy.  Past medical history, past surgical history, family history and social history were all reviewed and documented in the EPIC chart.  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  Numerous family members with type 2 diabetes.  ROS:  A ROS was performed and pertinent positives and negatives are included.  Exam:  Vitals:   12/09/18 1621  BP: 120/78  Weight: 146 lb (66.2 kg)  Height: 5\' 3"  (1.6 m)   Body mass index is 25.86 kg/m.   General appearance:  Normal Thyroid:  Symmetrical, normal in size, without palpable masses or nodularity. Respiratory  Auscultation:  Clear without wheezing or rhonchi Cardiovascular  Auscultation:  Regular rate, without rubs, murmurs or gallops  Edema/varicosities:  Not grossly evident Abdominal  Soft,nontender, without masses, guarding or rebound.  Liver/spleen:  No organomegaly noted  Hernia:  None appreciated  Skin  Inspection:  Grossly normal   Breasts: Examined lying and sitting.     Right: Without masses, retractions, discharge or axillary adenopathy.     Left: Without masses, retractions, discharge or axillary adenopathy. Gentitourinary   Inguinal/mons:  Normal without inguinal adenopathy  External genitalia:  Normal  BUS/Urethra/Skene's glands:  Normal  Vagina:  Normal  Cervix:  Normal  Uterus:  normal in size, shape and contour.  Midline and mobile  Adnexa/parametria:     Rt: Without masses or tenderness.   Lt: Without masses or tenderness.  Anus and perineum: Normal  Digital rectal exam: Normal sphincter tone without palpated masses or tenderness  Assessment/Plan:  58 y.o. SBF G0  for annual exam with no complaints.  Light monthly cycle on lo Loestrin Fibroid uterus Hypertension/asthma-primary care manages labs and meds  Plan: FSH week of placebo, schedule lab appointment.  Lo Loestrin prescription, proper use, risk for blood clots, strokes  reviewed, would like to continue due to history of menorrhagia.  SBEs, continue annual screening mammogram, calcium rich foods, vitamin D 1000 daily encouraged.  Importance of screening colonoscopy reviewed, [facility] information given instructed to schedule.  Continue healthy lifestyle of regular exercise, healthy diet.  Pap with HR HPV typing, new screening guidelines reviewed.   [place], 4:59 PM 12/09/2018

## 2018-12-09 NOTE — Patient Instructions (Signed)
[facility]  [phone]  Dr Carlean Purl   Health Maintenance for Postmenopausal Women Menopause is a normal process in which your reproductive ability comes to an end. This process happens gradually over a span of months to years, usually between the ages of 13 and 65. Menopause is complete when you have missed 12 consecutive menstrual periods. It is important to talk with your health care provider about some of the most common conditions that affect postmenopausal women, such as heart disease, cancer, and bone loss (osteoporosis). Adopting a healthy lifestyle and getting preventive care can help to promote your health and wellness. Those actions can also lower your chances of developing some of these common conditions. What should I know about menopause? During menopause, you may experience a number of symptoms, such as:  Moderate-to-severe hot flashes.  Night sweats.  Decrease in sex drive.  Mood swings.  Headaches.  Tiredness.  Irritability.  Memory problems.  Insomnia. Choosing to treat or not to treat menopausal changes is an individual decision that you make with your health care provider. What should I know about hormone replacement therapy and supplements? Hormone therapy products are effective for treating symptoms that are associated with menopause, such as hot flashes and night sweats. Hormone replacement carries certain risks, especially as you become older. If you are thinking about using estrogen or estrogen with progestin treatments, discuss the benefits and risks with your health care provider. What should I know about heart disease and stroke? Heart disease, heart attack, and stroke become more likely as you age. This may be due, in part, to the hormonal changes that your body experiences during menopause. These can affect how your body processes dietary fats, triglycerides, and cholesterol. Heart attack and stroke are both medical emergencies. There are many things that you  can do to help prevent heart disease and stroke:  Have your blood pressure checked at least every 1-2 years. High blood pressure causes heart disease and increases the risk of stroke.  If you are 1-31 years old, ask your health care provider if you should take aspirin to prevent a heart attack or a stroke.  Do not use any tobacco products, including cigarettes, chewing tobacco, or electronic cigarettes. If you need help quitting, ask your health care provider.  It is important to eat a healthy diet and maintain a healthy weight. ? Be sure to include plenty of vegetables, fruits, low-fat dairy products, and lean protein. ? Avoid eating foods that are high in solid fats, added sugars, or salt (sodium).  Get regular exercise. This is one of the most important things that you can do for your health. ? Try to exercise for at least 150 minutes each week. The type of exercise that you do should increase your heart rate and make you sweat. This is known as moderate-intensity exercise. ? Try to do strengthening exercises at least twice each week. Do these in addition to the moderate-intensity exercise.  Know your numbers.Ask your health care provider to check your cholesterol and your blood glucose. Continue to have your blood tested as directed by your health care provider.  What should I know about cancer screening? There are several types of cancer. Take the following steps to reduce your risk and to catch any cancer development as early as possible. Breast Cancer  Practice breast self-awareness. ? This means understanding how your breasts normally appear and feel. ? It also means doing regular breast self-exams. Let your health care provider know about any changes, no  matter how small.  If you are 61 or older, have a clinician do a breast exam (clinical breast exam or CBE) every year. Depending on your age, family history, and medical history, it may be recommended that you also have a yearly  breast X-ray (mammogram).  If you have a family history of breast cancer, talk with your health care provider about genetic screening.  If you are at high risk for breast cancer, talk with your health care provider about having an MRI and a mammogram every year.  Breast cancer (BRCA) gene test is recommended for women who have family members with BRCA-related cancers. Results of the assessment will determine the need for genetic counseling and BRCA1 and for BRCA2 testing. BRCA-related cancers include these types: ? Breast. This occurs in males or females. ? Ovarian. ? Tubal. This may also be called fallopian tube cancer. ? Cancer of the abdominal or pelvic lining (peritoneal cancer). ? Prostate. ? Pancreatic. Cervical, Uterine, and Ovarian Cancer Your health care provider may recommend that you be screened regularly for cancer of the pelvic organs. These include your ovaries, uterus, and vagina. This screening involves a pelvic exam, which includes checking for microscopic changes to the surface of your cervix (Pap test).  For women ages 21-65, health care providers may recommend a pelvic exam and a Pap test every three years. For women ages 14-65, they may recommend the Pap test and pelvic exam, combined with testing for human papilloma virus (HPV), every five years. Some types of HPV increase your risk of cervical cancer. Testing for HPV may also be done on women of any age who have unclear Pap test results.  Other health care providers may not recommend any screening for nonpregnant women who are considered low risk for pelvic cancer and have no symptoms. Ask your health care provider if a screening pelvic exam is right for you.  If you have had past treatment for cervical cancer or a condition that could lead to cancer, you need Pap tests and screening for cancer for at least 20 years after your treatment. If Pap tests have been discontinued for you, your risk factors (such as having a new  sexual partner) need to be reassessed to determine if you should start having screenings again. Some women have medical problems that increase the chance of getting cervical cancer. In these cases, your health care provider may recommend that you have screening and Pap tests more often.  If you have a family history of uterine cancer or ovarian cancer, talk with your health care provider about genetic screening.  If you have vaginal bleeding after reaching menopause, tell your health care provider.  There are currently no reliable tests available to screen for ovarian cancer. Lung Cancer Lung cancer screening is recommended for adults 24-19 years old who are at high risk for lung cancer because of a history of smoking. A yearly low-dose CT scan of the lungs is recommended if you:  Currently smoke.  Have a history of at least 30 pack-years of smoking and you currently smoke or have quit within the past 15 years. A pack-year is smoking an average of one pack of cigarettes per day for one year. Yearly screening should:  Continue until it has been 15 years since you quit.  Stop if you develop a health problem that would prevent you from having lung cancer treatment. Colorectal Cancer  This type of cancer can be detected and can often be prevented.  Routine colorectal cancer  screening usually begins at age 46 and continues through age 79.  If you have risk factors for colon cancer, your health care provider may recommend that you be screened at an earlier age.  If you have a family history of colorectal cancer, talk with your health care provider about genetic screening.  Your health care provider may also recommend using home test kits to check for hidden blood in your stool.  A small camera at the end of a tube can be used to examine your colon directly (sigmoidoscopy or colonoscopy). This is done to check for the earliest forms of colorectal cancer.  Direct examination of the colon  should be repeated every 5-10 years until age 35. However, if early forms of precancerous polyps or small growths are found or if you have a family history or genetic risk for colorectal cancer, you may need to be screened more often. Skin Cancer  Check your skin from head to toe regularly.  Monitor any moles. Be sure to tell your health care provider: ? About any new moles or changes in moles, especially if there is a change in a mole's shape or color. ? If you have a mole that is larger than the size of a pencil eraser.  If any of your family members has a history of skin cancer, especially at a Tiffany Cline age, talk with your health care provider about genetic screening.  Always use sunscreen. Apply sunscreen liberally and repeatedly throughout the day.  Whenever you are outside, protect yourself by wearing long sleeves, pants, a wide-brimmed hat, and sunglasses. What should I know about osteoporosis? Osteoporosis is a condition in which bone destruction happens more quickly than new bone creation. After menopause, you may be at an increased risk for osteoporosis. To help prevent osteoporosis or the bone fractures that can happen because of osteoporosis, the following is recommended:  If you are 75-11 years old, get at least 1,000 mg of calcium and at least 600 mg of vitamin D per day.  If you are older than age 93 but younger than age 11, get at least 1,200 mg of calcium and at least 600 mg of vitamin D per day.  If you are older than age 33, get at least 1,200 mg of calcium and at least 800 mg of vitamin D per day. Smoking and excessive alcohol intake increase the risk of osteoporosis. Eat foods that are rich in calcium and vitamin D, and do weight-bearing exercises several times each week as directed by your health care provider. What should I know about how menopause affects my mental health? Depression may occur at any age, but it is more common as you become older. Common symptoms of  depression include:  Low or sad mood.  Changes in sleep patterns.  Changes in appetite or eating patterns.  Feeling an overall lack of motivation or enjoyment of activities that you previously enjoyed.  Frequent crying spells. Talk with your health care provider if you think that you are experiencing depression. What should I know about immunizations? It is important that you get and maintain your immunizations. These include:  Tetanus, diphtheria, and pertussis (Tdap) booster vaccine.  Influenza every year before the flu season begins.  Pneumonia vaccine.  Shingles vaccine. Your health care provider may also recommend other immunizations. This information is not intended to replace advice given to you by your health care provider. Make sure you discuss any questions you have with your health care provider. Document Released: 01/05/2006 Document  Revised: 06/02/2016 Document Reviewed: 08/17/2015 Elsevier Interactive Patient Education  [employer].

## 2018-12-10 ENCOUNTER — Telehealth: Payer: Self-pay | Admitting: *Deleted

## 2018-12-10 NOTE — Addendum Note (Signed)
Addended by: Lorine Bears on: 12/10/2018 07:57 AM   Modules accepted: Orders

## 2018-12-10 NOTE — Telephone Encounter (Signed)
Prior authorization for LoLoestrin FE 1/10 mcg via surescipts website, will wait for response.

## 2018-12-12 LAB — PAP, TP IMAGING W/ HPV RNA, RFLX HPV TYPE 16,18/45: HPV DNA High Risk: NOT DETECTED

## 2018-12-17 NOTE — Telephone Encounter (Signed)
[facility] denied LoLoestrin 1-10 mcg tablet, states not covered on formulary list.

## 2018-12-19 ENCOUNTER — Other Ambulatory Visit: Payer: BLUE CROSS/BLUE SHIELD

## 2018-12-19 DIAGNOSIS — N951 Menopausal and female climacteric states: Secondary | ICD-10-CM

## 2018-12-20 LAB — FOLLICLE STIMULATING HORMONE: FSH: 2.4 m[IU]/mL

## 2019-01-03 ENCOUNTER — Other Ambulatory Visit: Payer: Self-pay | Admitting: Pulmonary Disease

## 2019-01-03 DIAGNOSIS — J4541 Moderate persistent asthma with (acute) exacerbation: Secondary | ICD-10-CM

## 2019-01-03 MED ORDER — PROAIR HFA 108 (90 BASE) MCG/ACT IN AERS: INHALATION_SPRAY | RESPIRATORY_TRACT | 2 refills | Status: DC

## 2019-01-03 NOTE — Telephone Encounter (Signed)
Rx for Astelin & proair has been sent to preferred pharmacy. Lm to make pt aware.

## 2019-01-03 NOTE — Telephone Encounter (Signed)
Patient called for refill Astelin & ProAir HFA.  Pharmacy is [facility],  [phone].  Patient phone number is [phone].

## 2019-02-07 ENCOUNTER — Encounter: Payer: Self-pay | Admitting: Gastroenterology

## 2019-02-14 ENCOUNTER — Other Ambulatory Visit: Payer: Self-pay | Admitting: Pulmonary Disease

## 2019-03-12 ENCOUNTER — Other Ambulatory Visit: Payer: Self-pay | Admitting: Pulmonary Disease

## 2019-05-06 ENCOUNTER — Other Ambulatory Visit: Payer: Self-pay | Admitting: *Deleted

## 2019-05-06 DIAGNOSIS — J4541 Moderate persistent asthma with (acute) exacerbation: Secondary | ICD-10-CM

## 2019-05-06 MED ORDER — MONTELUKAST SODIUM 10 MG PO TABS
10.0000 mg | ORAL_TABLET | Freq: Every day | ORAL | 3 refills | Status: DC
Start: 2019-05-06 — End: 2020-07-22

## 2019-07-16 ENCOUNTER — Telehealth: Payer: Self-pay | Admitting: Pulmonary Disease

## 2019-07-16 MED ORDER — DULERA 200-5 MCG/ACT IN AERO: 2.0000 | INHALATION_SPRAY | Freq: Two times a day (BID) | RESPIRATORY_TRACT | 2 refills | Status: DC

## 2019-07-16 NOTE — Telephone Encounter (Signed)
Called and spoke with pt regarding her refill request for Dulera 200. Pt last seen 08/10/2019 by Dr. Vaughan Browner. Ruthe Mannan last filled 03/10/2019 1 inhaler x2 refills. Pt has an upcoming appt w/ Dr. Vaughan Browner 08/11/2019 at 10:00 AM. I let pt know we would send a refill for her Ruthe Mannan to her preferred pharmacy, [facility] in [facility]. Pt verbalized understanding with no additional questions. Order has been placed. Nothing further needed at this time.

## 2019-08-11 ENCOUNTER — Ambulatory Visit: Payer: BLUE CROSS/BLUE SHIELD | Admitting: Pulmonary Disease

## 2019-08-11 ENCOUNTER — Other Ambulatory Visit: Payer: Self-pay

## 2019-08-11 ENCOUNTER — Encounter: Payer: Self-pay | Admitting: Pulmonary Disease

## 2019-08-11 VITALS — BP 120/80 | HR 82 | Temp 96.4°F | Ht 64.0 in | Wt 140.4 lb

## 2019-08-11 DIAGNOSIS — J4541 Moderate persistent asthma with (acute) exacerbation: Secondary | ICD-10-CM

## 2019-08-11 NOTE — Patient Instructions (Signed)
Glad you are doing well with regard to your breathing Continue the Dulera, Singulair and Flonase as prescribed Follow-up 1 year Please give [place] a call sooner if you have any change in your symptoms.

## 2019-08-11 NOTE — Progress Notes (Signed)
Tiffany Cline    [id number]    December 21, 1960  Primary Care Physician:Jeffery, Domingo Mend, PA  Referring Physician: Harrison Mons, [place],  [place] [id number]  Chief complaint:  Follow up for Moderate persistent asthma  HPI: [name] is a 58 year old with past medical history of asthma, hypertension. She was diagnosed around 2011 and has been maintained on Advair and more recently [facility]. She has good control for most year but does have worsening of symptoms in wintertime. She has seasonal allergies, occasional postnasal drip. She denies any heartburn symptoms. She is allergic to cats. She has a dog at home but is not sensitive to it. 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  2018-Dulera reduced to 100 but had to go back up due to increased dyspnea, wheezing  Interim History: Continues on Dulera 200.  She is doing well this year Feels that wearing a mask definitely helps with her asthma symptoms Continues to use her rescue inhaler  ACQ-6 08/11/19- 0.5             Outpatient Encounter Medications as of 08/11/2019  Medication Sig  . amLODipine (NORVASC) 5 MG tablet TAKE 1 TABLET BY MOUTH DAILY  . azelastine (ASTELIN) 0.1 % nasal spray USE 2 SPRAY(S) IN EACH NOSTRIL TWICE DAILY AS DIRECTED  . cyclobenzaprine (FLEXERIL) 10 MG tablet TAKE ONE TABLET BY MOUTH THREE TIMES DAILY AS NEEDED FOR MUSCLE SPASMS  . fluticasone (FLONASE) 50 MCG/ACT nasal spray USE TWO SPRAY(S) IN EACH NOSTRIL ONCE DAILY  . GuaiFENesin (MUCINEX PO) Take by mouth.    . mometasone-formoterol (DULERA) 200-5 MCG/ACT AERO Inhale 2 puffs into the lungs 2 (two) times daily.  . montelukast (SINGULAIR) 10 MG tablet Take 1 tablet (10 mg total) by mouth at bedtime.  . Norethindrone-Ethinyl Estradiol-Fe Biphas (LO LOESTRIN FE) 1 MG-10 MCG / 10 MCG tablet Take 1 tablet daily  . PROAIR HFA 108 (90 Base) MCG/ACT inhaler INHALE 2 PUFFS BY MOUTH  EVERY 4 HOURS AS NEEDED FOR WHEEZING, COUGH OR SHORTNESS OF BREATH  . UNABLE TO FIND Nebulizer machine. Dx code J45.31   Facility-Administered Encounter Medications as of 08/11/2019  Medication  . ipratropium (ATROVENT) nebulizer solution 0.5 mg    Physical Exam: Blood pressure 120/80, pulse 82, temperature (!) 96.4 F (35.8 C), temperature source Temporal, height 5\' 4"  (1.626 m), weight 140 lb 6.4 oz (63.7 kg), SpO2 98 %. Gen:      No acute distress HEENT:  EOMI, sclera anicteric Neck:     No masses; no thyromegaly Lungs:    Clear to auscultation bilaterally; normal respiratory effort CV:         Regular rate and rhythm; no murmurs Abd:      + bowel sounds; soft, non-tender; no palpable masses, no distension Ext:    No edema; adequate peripheral perfusion Skin:      Warm and dry; no rash Neuro: alert and oriented x 3 Psych: normal mood and affect  Data Reviewed: Imaging Chest x-ray 04/12/16- Mild rt apical scarring. Chest x-ray 08/09/18- mild hyperinflation with stable linear scarring. I have reviewed the images personally.  PFTs  06/02/15 FVC 3.02 [110%), FEV1 2.51 (115%), F/F 83 Normal spirometry  FENO 06/15/16-10  Labs CBC with diff 06/15/16. WBC 5.9, absolute eosinophil count- 112 Blood allergy profile- 06/15/16- Negative, IgE 42  Assessment:  Moderate persistent asthma She has not tolerated a  down titration in Volin inhaler.  Symptoms are stable on a dose of 200/50. She'll continue on albuterol inhaler as needed  Sinusitiis, Allergic rhinitis Treating with Singulair, Flonase  Abnormal CXR Chest x-ray shows mild scarring is stable. We'll continue to monitor. Further evaluation with a CT scan was discussed at previous visit but she declined.  Plan/Recommendations: - Continue dulera to 200/50 - Continue Singulair, Flonase  Follow-up in 1 year                                                                                                   Marshell Garfinkel MD [facility] 08/11/2019, 10:23 AM  CC: Harrison Mons, PA

## 2019-08-26 ENCOUNTER — Encounter: Payer: Self-pay | Admitting: Gynecology

## 2019-10-29 ENCOUNTER — Other Ambulatory Visit: Payer: Self-pay | Admitting: Pulmonary Disease

## 2019-10-29 ENCOUNTER — Telehealth: Payer: Self-pay | Admitting: Pulmonary Disease

## 2019-10-29 MED ORDER — DULERA 200-5 MCG/ACT IN AERO: 2.0000 | INHALATION_SPRAY | Freq: Two times a day (BID) | RESPIRATORY_TRACT | 11 refills | Status: DC

## 2019-10-29 NOTE — Telephone Encounter (Signed)
Rx was refilled  Left detailed msg on machine letting the pt know this was done

## 2019-12-09 ENCOUNTER — Other Ambulatory Visit: Payer: Self-pay | Admitting: Obstetrics & Gynecology

## 2019-12-09 ENCOUNTER — Ambulatory Visit
Admission: RE | Admit: 2019-12-09 | Discharge: 2019-12-09 | Disposition: A | Discharge status: Home / Self Care | Payer: BC Managed Care – PPO | Source: Ambulatory Visit | Attending: Obstetrics & Gynecology | Admitting: Obstetrics & Gynecology

## 2019-12-09 ENCOUNTER — Other Ambulatory Visit: Payer: Self-pay

## 2019-12-09 DIAGNOSIS — Z1231 Encounter for screening mammogram for malignant neoplasm of breast: Secondary | ICD-10-CM

## 2019-12-22 ENCOUNTER — Ambulatory Visit (INDEPENDENT_AMBULATORY_CARE_PROVIDER_SITE_OTHER): Payer: BC Managed Care – PPO | Admitting: Women's Health

## 2019-12-22 ENCOUNTER — Encounter: Payer: Self-pay | Admitting: Women's Health

## 2019-12-22 ENCOUNTER — Other Ambulatory Visit: Payer: Self-pay

## 2019-12-22 VITALS — BP 118/80 | Ht 64.0 in | Wt 135.0 lb

## 2019-12-22 DIAGNOSIS — Z01419 Encounter for gynecological examination (general) (routine) without abnormal findings: Secondary | ICD-10-CM

## 2019-12-22 DIAGNOSIS — N926 Irregular menstruation, unspecified: Secondary | ICD-10-CM | POA: Diagnosis not present

## 2019-12-22 DIAGNOSIS — Z3041 Encounter for surveillance of contraceptive pills: Secondary | ICD-10-CM | POA: Diagnosis not present

## 2019-12-22 LAB — FOLLICLE STIMULATING HORMONE: FSH: 7.6 m[IU]/mL

## 2019-12-22 MED ORDER — NORETHIN-ETH ESTRAD-FE BIPHAS 1 MG-10 MCG / 10 MCG PO TABS: ORAL_TABLET | ORAL | 4 refills | Status: DC

## 2019-12-22 NOTE — Patient Instructions (Signed)
It was good to see you today [name] [phone] Health Maintenance, Female Adopting a healthy lifestyle and getting preventive care are important in promoting health and wellness. Ask your health care provider about:  The right schedule for you to have regular tests and exams.  Things you can do on your own to prevent diseases and keep yourself healthy. What should I know about diet, weight, and exercise? Eat a healthy diet   Eat a diet that includes plenty of vegetables, fruits, low-fat dairy products, and lean protein.  Do not eat a lot of foods that are high in solid fats, added sugars, or sodium. Maintain a healthy weight Body mass index (BMI) is used to identify weight problems. It estimates body fat based on height and weight. Your health care provider can help determine your BMI and help you achieve or maintain a healthy weight. Get regular exercise Get regular exercise. This is one of the most important things you can do for your health. Most adults should:  Exercise for at least 150 minutes each week. The exercise should increase your heart rate and make you sweat (moderate-intensity exercise).  Do strengthening exercises at least twice a week. This is in addition to the moderate-intensity exercise.  Spend less time sitting. Even light physical activity can be beneficial. Watch cholesterol and blood lipids Have your blood tested for lipids and cholesterol at 57 years of age, then have this test every 5 years. Have your cholesterol levels checked more often if:  Your lipid or cholesterol levels are high.  You are older than 57 years of age.  You are at high risk for heart disease. What should I know about cancer screening? Depending on your health history and family history, you may need to have cancer screening at various ages. This may include screening for:  Breast cancer.  Cervical cancer.  Colorectal  cancer.  Skin cancer.  Lung cancer. What should I know about heart disease, diabetes, and high blood pressure? Blood pressure and heart disease  High blood pressure causes heart disease and increases the risk of stroke. This is more likely to develop in people who have high blood pressure readings, are of African descent, or are overweight.  Have your blood pressure checked: ? Every 3-5 years if you are 97-16 years of age. ? Every year if you are 71 years old or older. Diabetes Have regular diabetes screenings. This checks your fasting blood sugar level. Have the screening done:  Once every three years after age 66 if you are at a normal weight and have a low risk for diabetes.  More often and at a younger age if you are overweight or have a high risk for diabetes. What should I know about preventing infection? Hepatitis B If you have a higher risk for hepatitis B, you should be screened for this virus. Talk with your health care provider to find out if you are at risk for hepatitis B infection. Hepatitis C Testing is recommended for:  Everyone born from [birth year] through 1965.  Anyone with known risk factors for hepatitis C. Sexually transmitted infections (STIs)  Get screened for STIs, including gonorrhea and chlamydia, if: ? You are sexually active and are younger than 57 years of age. ? You are older than 58 years of age and your health care provider tells you that you are at risk for this type of infection. ? Your sexual activity has changed since you were  last screened, and you are at increased risk for chlamydia or gonorrhea. Ask your health care provider if you are at risk.  Ask your health care provider about whether you are at high risk for HIV. Your health care provider may recommend a prescription medicine to help prevent HIV infection. If you choose to take medicine to prevent HIV, you should first get tested for HIV. You should then be tested every 3 months for as long as  you are taking the medicine. Pregnancy  If you are about to stop having your period (premenopausal) and you may become pregnant, seek counseling before you get pregnant.  Take 400 to 800 micrograms (mcg) of folic acid every day if you become pregnant.  Ask for birth control (contraception) if you want to prevent pregnancy. Osteoporosis and menopause Osteoporosis is a disease in which the bones lose minerals and strength with aging. This can result in bone fractures. If you are 32 years old or older, or if you are at risk for osteoporosis and fractures, ask your health care provider if you should:  Be screened for bone loss.  Take a calcium or vitamin D supplement to lower your risk of fractures.  Be given hormone replacement therapy (HRT) to treat symptoms of menopause. Follow these instructions at home: Lifestyle  Do not use any products that contain nicotine or tobacco, such as cigarettes, e-cigarettes, and chewing tobacco. If you need help quitting, ask your health care provider.  Do not use street drugs.  Do not share needles.  Ask your health care provider for help if you need support or information about quitting drugs. Alcohol use  Do not drink alcohol if: ? Your health care provider tells you not to drink. ? You are pregnant, may be pregnant, or are planning to become pregnant.  If you drink alcohol: ? Limit how much you use to 0-1 drink a day. ? Limit intake if you are breastfeeding.  Be aware of how much alcohol is in your drink. In the [place] one drink equals one 12 oz bottle of beer (355 mL), one 5 oz glass of wine (148 mL), or one 1 oz glass of hard liquor (44 mL). General instructions  Schedule regular health, dental, and eye exams.  Stay current with your vaccines.  Tell your health care provider if: ? You often feel depressed. ? You have ever been abused or do not feel safe at home. Summary  Adopting a healthy lifestyle and getting preventive care are  important in promoting health and wellness.  Follow your health care provider's instructions about healthy diet, exercising, and getting tested or screened for diseases.  Follow your health care provider's instructions on monitoring your cholesterol and blood pressure. This information is not intended to replace advice given to you by your health care provider. Make sure you discuss any questions you have with your health care provider. Document Revised: 11/06/2018 Document Reviewed: 11/06/2018 Elsevier Patient Education  2020 [employer].

## 2019-12-22 NOTE — Progress Notes (Signed)
Tiffany Cline 03/21/1961 [id number]    History:    Presents for annual exam.  Very light 3-day cycle on Loestrin.  Starting to get some menopausal symptoms, not daily.  Mother in her late 38s when became menopausal.  History of menorrhagia.  Not sexually active/years.  Normal Pap and mammogram history..  Hypertension primary care manages labs and meds.  Negative Cologuard has not had a colonoscopy.  Has had Covid vaccine and Shingrix.  Past medical history, past surgical history, family history and social history were all reviewed and documented in the EPIC chart.  Pharmacist at [employer].  Numerous aunts with type 2 diabetes.  Parents hypertension, mother diabetes, deceased.  ROS:  A ROS was performed and pertinent positives and negatives are included.  Exam:  Vitals:   12/22/19 1127  BP: 118/80  Weight: 135 lb (61.2 kg)  Height: 5\' 4"  (1.626 m)   Body mass index is 23.17 kg/m.   General appearance:  Normal Thyroid:  Symmetrical, normal in size, without palpable masses or nodularity. Respiratory  Auscultation:  Clear without wheezing or rhonchi Cardiovascular  Auscultation:  Regular rate, without rubs, murmurs or gallops  Edema/varicosities:  Not grossly evident Abdominal  Soft,nontender, without masses, guarding or rebound.  Liver/spleen:  No organomegaly noted  Hernia:  None appreciated  Skin  Inspection:  Grossly normal   Breasts: Examined lying and sitting.     Right: Without masses, retractions, discharge or axillary adenopathy.     Left: Without masses, retractions, discharge or axillary adenopathy. Gentitourinary   Inguinal/mons:  Normal without inguinal adenopathy  External genitalia:  Normal  BUS/Urethra/Skene's glands:  Normal  Vagina:  Normal  Cervix:  Normal  Uterus:  normal in size, shape and contour.  Midline and mobile  Adnexa/parametria:     Rt: Without masses or tenderness.   Lt: Without masses or tenderness.  Anus and perineum: Normal  Digital rectal  exam: Normal sphincter tone without palpated masses or tenderness  Assessment/Plan:  59 y.o. S BF G0 for annual exam with no urinary or vaginal complaints.  Light 3-day cycle on lo Loestrin Hypertension-primary care manages labs and meds  Plan: We will check [place], reviewed since cycles are now very light could stop the Lo Loestrin and monitor cycles, history of menorrhagia.[name] Loestrin prescription, proper use, slight risk for blood clots and strokes reviewed.  SBEs, continue annual screening mammogram, calcium rich foods, vitamin D 2000 IUs daily encouraged.    Screening colonoscopy discussed and encouraged, [facility] information given.  Pap normal 2020, new screening guidelines reviewed.    [place], 11:58 AM 12/22/2019

## 2019-12-25 ENCOUNTER — Telehealth: Payer: Self-pay | Admitting: *Deleted

## 2019-12-25 NOTE — Telephone Encounter (Signed)
PA done via surescripts for lo loestrin 1-10 mcg tablet , will wait for response.

## 2019-12-25 NOTE — Telephone Encounter (Signed)
Rx has been denied by insurance. Rx is not covered by per FDA for menorrhagia.Marland Kitchen

## 2020-01-06 NOTE — Telephone Encounter (Signed)
Spoke with patient and read her the My Chart email.

## 2020-05-20 ENCOUNTER — Other Ambulatory Visit: Payer: Self-pay | Admitting: *Deleted

## 2020-05-20 MED ORDER — DULERA 200-5 MCG/ACT IN AERO: 2.0000 | INHALATION_SPRAY | Freq: Two times a day (BID) | RESPIRATORY_TRACT | 3 refills | Status: DC

## 2020-06-08 ENCOUNTER — Ambulatory Visit: Payer: BC Managed Care – PPO | Admitting: Family

## 2020-06-08 ENCOUNTER — Encounter: Payer: Self-pay | Admitting: Family

## 2020-06-08 ENCOUNTER — Other Ambulatory Visit: Payer: Self-pay

## 2020-06-08 ENCOUNTER — Telehealth: Payer: Self-pay | Admitting: Family

## 2020-06-08 VITALS — BP 126/80 | HR 86 | Resp 16 | Ht 64.0 in | Wt 137.0 lb

## 2020-06-08 DIAGNOSIS — Z0000 Encounter for general adult medical examination without abnormal findings: Secondary | ICD-10-CM

## 2020-06-08 DIAGNOSIS — I10 Essential (primary) hypertension: Secondary | ICD-10-CM | POA: Diagnosis not present

## 2020-06-08 DIAGNOSIS — J45909 Unspecified asthma, uncomplicated: Secondary | ICD-10-CM

## 2020-06-08 DIAGNOSIS — M545 Low back pain, unspecified: Secondary | ICD-10-CM

## 2020-06-08 DIAGNOSIS — G43829 Menstrual migraine, not intractable, without status migrainosus: Secondary | ICD-10-CM

## 2020-06-08 DIAGNOSIS — R195 Other fecal abnormalities: Secondary | ICD-10-CM

## 2020-06-08 LAB — CBC WITH DIFFERENTIAL/PLATELET
Basophils Absolute: 0.1 10*3/uL (ref 0.0–0.1)
Basophils Relative: 0.7 % (ref 0.0–3.0)
Eosinophils Absolute: 0.1 10*3/uL (ref 0.0–0.7)
Eosinophils Relative: 1.8 % (ref 0.0–5.0)
HCT: 39.8 % (ref 36.0–46.0)
Hemoglobin: 13 g/dL (ref 12.0–15.0)
Lymphocytes Relative: 19.6 % (ref 12.0–46.0)
Lymphs Abs: 1.3 10*3/uL (ref 0.7–4.0)
MCHC: 32.6 g/dL (ref 30.0–36.0)
MCV: 89 fl (ref 78.0–100.0)
Monocytes Absolute: 0.7 10*3/uL (ref 0.1–1.0)
Monocytes Relative: 10.7 % (ref 3.0–12.0)
Neutro Abs: 4.5 10*3/uL (ref 1.4–7.7)
Neutrophils Relative %: 67.2 % (ref 43.0–77.0)
Platelets: 250 10*3/uL (ref 150.0–400.0)
RBC: 4.47 Mil/uL (ref 3.87–5.11)
RDW: 13.6 % (ref 11.5–15.5)
WBC: 6.8 10*3/uL (ref 4.0–10.5)

## 2020-06-08 LAB — HEPATIC FUNCTION PANEL
ALT: 20 U/L (ref 0–35)
AST: 16 U/L (ref 0–37)
Albumin: 3.9 g/dL (ref 3.5–5.2)
Alkaline Phosphatase: 59 U/L (ref 39–117)
Bilirubin, Direct: 0 mg/dL (ref 0.0–0.3)
Total Bilirubin: 0.2 mg/dL (ref 0.2–1.2)
Total Protein: 6.7 g/dL (ref 6.0–8.3)

## 2020-06-08 LAB — LIPID PANEL
Cholesterol: 179 mg/dL (ref 0–200)
HDL: 75.3 mg/dL (ref >39.00)
LDL Cholesterol: 90 mg/dL (ref 0–99)
NonHDL: 103.45
Total CHOL/HDL Ratio: 2
Triglycerides: 67 mg/dL (ref 0.0–149.0)
VLDL: 13.4 mg/dL (ref 0.0–40.0)

## 2020-06-08 LAB — BASIC METABOLIC PANEL
BUN: 12 mg/dL (ref 6–23)
CO2: 31 mEq/L (ref 19–32)
Calcium: 9.2 mg/dL (ref 8.4–10.5)
Chloride: 101 mEq/L (ref 96–112)
Creatinine, Ser: 0.7 mg/dL (ref 0.40–1.20)
GFR: 104.31 mL/min (ref >60.00)
Glucose, Bld: 97 mg/dL (ref 70–99)
Potassium: 3.7 mEq/L (ref 3.5–5.1)
Sodium: 138 mEq/L (ref 135–145)

## 2020-06-08 LAB — TSH: TSH: 0.62 u[IU]/mL (ref 0.35–4.50)

## 2020-06-08 MED ORDER — CYCLOBENZAPRINE HCL 10 MG PO TABS
10.0000 mg | ORAL_TABLET | Freq: Three times a day (TID) | ORAL | 0 refills | Status: DC | PRN
Start: 2020-06-08 — End: 2021-06-02

## 2020-06-08 MED ORDER — AZELASTINE HCL 0.1 % NA SOLN: NASAL | 1 refills | Status: DC

## 2020-06-08 MED ORDER — AMLODIPINE BESYLATE 5 MG PO TABS
ORAL_TABLET | ORAL | 1 refills | Status: DC
Start: 2020-06-08 — End: 2020-11-15

## 2020-06-08 MED ORDER — SUMATRIPTAN SUCCINATE 50 MG PO TABS: ORAL_TABLET | ORAL | 5 refills | Status: DC

## 2020-06-08 MED ORDER — PROMETHAZINE HCL 25 MG PO TABS
25.0000 mg | ORAL_TABLET | Freq: Three times a day (TID) | ORAL | 0 refills | Status: DC | PRN
Start: 2020-06-08 — End: 2021-03-18

## 2020-06-08 NOTE — Progress Notes (Signed)
Subjective:    Patient ID: Tiffany Cline, female    DOB: 11-05-1961, 59 y.o.   MRN: [id number]  HPI   Patient is a 59 yr old female who presents today to establish care.  HTN-  Maintained on amlodipine.   BP Readings from Last 3 Encounters:  06/08/20 126/80  12/22/19 118/80  08/11/19 120/80   Asthma- followed by pulmonology. Maintenance medications include singulair and dulera. She uses Proair prn. Reports adult onset asthma.    Reports occasional low back pain uses flexeril prn (about 10 tabs/year). Requesting refill.   Immunizations:moderna, shingrix, pneumovax (23 and 13) are up to date Diet: reports healthy diet Exercise: walks 3-5 miles/day Colonoscopy: prefers cologuard- reports last cologuard about 4 yrs ago.  Pap Smear: 12/10/18 Mammogram: 12/09/19   Review of Systems  Constitutional: Negative for unexpected weight change.  HENT: Negative for hearing loss and rhinorrhea.   Respiratory: Negative for cough and shortness of breath.   Cardiovascular: Negative for chest pain and leg swelling.  Gastrointestinal: Negative for constipation and diarrhea.  Genitourinary: Negative for dysuria, frequency and menstrual problem.  Musculoskeletal: Negative for arthralgias and myalgias.  Skin: Negative for rash.  Neurological: Positive for headaches (migraine around time of period 4 times a year, uses promethazine prn and advil or tylenol).  Hematological: Negative for adenopathy.  Psychiatric/Behavioral:       Denies depression/anxiety    Past Medical History:  Diagnosis Date  . Asthma    induced by cold and allergies  . Hypertension   . Leiomyoma      Social History   Socioeconomic History  . Marital status: Single    Spouse name: n/a  . Number of children: 0  . Years of education: RPh  . Highest education level: Not on file  Occupational History  . Occupation: Pharmacist  Tobacco Use  . Smoking status: Never Smoker  . Smokeless tobacco: Never Used  Vaping Use    . Vaping Use: Never used  Substance and Sexual Activity  . Alcohol use: No  . Drug use: No  . Sexual activity: Not Currently    Birth control/protection: Pill  Other Topics Concern  . Not on file  Social History Narrative   Lives alone with her pets.   Family lives nearby.   Social Determinants of Health   Financial Resource Strain:   . Difficulty of Paying Living Expenses:   Food Insecurity:   . Worried About Charity fundraiser in the Last Year:   . Arboriculturist in the Last Year:   Transportation Needs:   . Film/video editor (Medical):   Marland Kitchen Lack of Transportation (Non-Medical):   Physical Activity:   . Days of Exercise per Week:   . Minutes of Exercise per Session:   Stress:   . Feeling of Stress :   Social Connections:   . Frequency of Communication with Friends and Family:   . Frequency of Social Gatherings with Friends and Family:   . Attends Religious Services:   . Active Member of Clubs or Organizations:   . Attends Archivist Meetings:   Marland Kitchen Marital Status:   Intimate Partner Violence:   . Fear of Current or Ex-Partner:   . Emotionally Abused:   Marland Kitchen Physically Abused:   . Sexually Abused:     Past Surgical History:  Procedure Laterality Date  . MYOMECTOMY  2001    Family History  Problem Relation Age of Onset  . Diabetes Mother   .  Hypertension Mother   . Hypertension Father   . Cancer Father        prostate  . Thyroid disease Father        benign tumor  . Heart disease Maternal Grandmother   . Hypertension Paternal Grandmother   . Asthma Paternal Grandfather     Allergies  Allergen Reactions  . Septra [Sulfamethoxazole-Trimethoprim] Shortness Of Breath and Rash  . Latex Itching and Rash    Itching and redness with rash with use of latex gloves when she had blood drawn  . Sulfa Antibiotics Rash    Current Outpatient Medications on File Prior to Visit  Medication Sig Dispense Refill  . amLODipine (NORVASC) 5 MG tablet TAKE 1  TABLET BY MOUTH DAILY 90 tablet 3  . DULERA 200-5 MCG/ACT AERO Inhale 2 puffs by mouth twice daily 13 g 6  . fluticasone (FLONASE) 50 MCG/ACT nasal spray USE TWO SPRAY(S) IN EACH NOSTRIL ONCE DAILY 16 g 11  . mometasone-formoterol (DULERA) 200-5 MCG/ACT AERO Inhale 2 puffs into the lungs 2 (two) times daily. 13 g 3  . montelukast (SINGULAIR) 10 MG tablet Take 1 tablet (10 mg total) by mouth at bedtime. 90 tablet 3  . Norethindrone-Ethinyl Estradiol-Fe Biphas (LO LOESTRIN FE) 1 MG-10 MCG / 10 MCG tablet Take 1 tablet daily 3 Package 4  . PROAIR HFA 108 (90 Base) MCG/ACT inhaler INHALE 2 PUFFS BY MOUTH EVERY 4 HOURS AS NEEDED FOR WHEEZING, COUGH OR SHORTNESS OF BREATH 18 g 2  . UNABLE TO FIND Nebulizer machine. Dx code J45.31 1 Device 0   Current Facility-Administered Medications on File Prior to Visit  Medication Dose Route Frequency Provider Last Rate Last Admin  . ipratropium (ATROVENT) nebulizer solution 0.5 mg  0.5 mg Nebulization Once Dunn, Ryan M, PA-C        BP 126/80 (BP Location: Left Arm, Patient Position: Sitting, Cuff Size: Normal)   Pulse 86   Resp 16   Ht 5\' 4"  (1.626 m)   Wt 137 lb (62.1 kg)   LMP 06/01/2020   SpO2 97%   BMI 23.52 kg/m       Objective:   Physical Exam  Physical Exam  Constitutional: She is oriented to person, place, and time. She appears well-developed and well-nourished. No distress.  HENT:  Head: Normocephalic and atraumatic.  Right Ear: Tympanic membrane and ear canal normal.  Left Ear: Tympanic membrane and ear canal normal.  Mouth/Throat: Not examined Eyes: Pupils are equal, round, and reactive to light. No scleral icterus.  Neck: Normal range of motion. No thyromegaly present.  Cardiovascular: Normal rate and regular rhythm.   No murmur heard. Pulmonary/Chest: Effort normal and breath sounds normal. No respiratory distress. He has no wheezes. She has no rales. She exhibits no tenderness.  Abdominal: Soft. Bowel sounds are normal. She  exhibits no distension and no mass. There is no tenderness. There is no rebound and no guarding.  Musculoskeletal: She exhibits no edema.  Lymphadenopathy:    She has no cervical adenopathy.  Neurological: She is alert and oriented to person, place, and time. She has normal patellar reflexes. She exhibits normal muscle tone. Coordination normal.  Skin: Skin is warm and dry.  Psychiatric: She has a normal mood and affect. Her behavior is normal. Judgment and thought content normal.  Breast/pelvic: deferred to [place]:   Preventative care- encouraged pt to continue healthy diet and regular exercise. Immunizations,  pap, mammo reviewed and  Up to date. She is due for cologuard. Will refer for colonoscopy (hx of positive cologuard- see 7/13 phone note). Obtain routine lab work.  HTN- bp stable on current dose of amlodipine- continue same.  Migraines- (menstrual migraines). Not always resolved with tylenol or motrin. New rx for imitrex provided.  Asthma- stable- management per pulmonology.  Low back pain- refill provided for prn flexeril.  This visit occurred during the SARS-CoV-2 public health emergency.  Safety protocols were in place, including screening questions prior to the visit, additional usage of staff PPE, and extensive cleaning of exam room while observing appropriate contact time as indicated for disinfecting solutions.         Assessment & Plan:

## 2020-06-08 NOTE — Telephone Encounter (Signed)
Actually- after the pt left- I saw that she had a positive cologuard back in 2018.  She really needs a colonoscopy. Repeat cologuard is not the appropriate test for her.  I have pended GI referral. Please sign after you notify pt.

## 2020-06-08 NOTE — Telephone Encounter (Signed)
Spoke with patient regarding referral. Patient agrees with plan.

## 2020-06-08 NOTE — Telephone Encounter (Signed)
Please initiate Cologuard testing for pt.

## 2020-06-08 NOTE — Patient Instructions (Signed)
Please complete lab work prior to leaving. Welcome to [place]!  Preventive Care 57-57 Years Old, Female Preventive care refers to visits with your health care provider and lifestyle choices that can promote health and wellness. This includes:  A yearly physical exam. This may also be called an annual well check.  Regular dental visits and eye exams.  Immunizations.  Screening for certain conditions.  Healthy lifestyle choices, such as eating a healthy diet, getting regular exercise, not using drugs or products that contain nicotine and tobacco, and limiting alcohol use. What can I expect for my preventive care visit? Physical exam Your health care provider will check your:  Height and weight. This may be used to calculate body mass index (BMI), which tells if you are at a healthy weight.  Heart rate and blood pressure.  Skin for abnormal spots. Counseling Your health care provider may ask you questions about your:  Alcohol, tobacco, and drug use.  Emotional well-being.  Home and relationship well-being.  Sexual activity.  Eating habits.  Work and work environment.  Method of birth control.  Menstrual cycle.  Pregnancy history. What immunizations do I need?  Influenza (flu) vaccine  This is recommended every year. Tetanus, diphtheria, and pertussis (Tdap) vaccine  You may need a Td booster every 10 years. Varicella (chickenpox) vaccine  You may need this if you have not been vaccinated. Zoster (shingles) vaccine  You may need this after age 57. Measles, mumps, and rubella (MMR) vaccine  You may need at least one dose of MMR if you were born in 1957 or later. You may also need a second dose. Pneumococcal conjugate (PCV13) vaccine  You may need this if you have certain conditions and were not previously vaccinated. Pneumococcal polysaccharide (PPSV23) vaccine  You may need one or two doses if you smoke cigarettes or if you have certain  conditions. Meningococcal conjugate (MenACWY) vaccine  You may need this if you have certain conditions. Hepatitis A vaccine  You may need this if you have certain conditions or if you travel or work in places where you may be exposed to hepatitis A. Hepatitis B vaccine  You may need this if you have certain conditions or if you travel or work in places where you may be exposed to hepatitis B. Haemophilus influenzae type b (Hib) vaccine  You may need this if you have certain conditions. Human papillomavirus (HPV) vaccine  If recommended by your health care provider, you may need three doses over 6 months. You may receive vaccines as individual doses or as more than one vaccine together in one shot (combination vaccines). Talk with your health care provider about the risks and benefits of combination vaccines. What tests do I need? Blood tests  Lipid and cholesterol levels. These may be checked every 5 years, or more frequently if you are over 50 years old.  Hepatitis C test.  Hepatitis B test. Screening  Lung cancer screening. You may have this screening every year starting at age 57 if you have a 30-pack-year history of smoking and currently smoke or have quit within the past 15 years.  Colorectal cancer screening. All adults should have this screening starting at age 50 and continuing until age 75. Your health care provider may recommend screening at age 45 if you are at increased risk. You will have tests every 1-10 years, depending on your results and the type of screening test.  Diabetes screening. This is done by checking your blood sugar (glucose) after you   you have not eaten for a while (fasting). You may have this done every 1-3 years.  Mammogram. This may be done every 1-2 years. Talk with your health care provider about when you should start having regular mammograms. This may depend on whether you have a family history of breast cancer.  BRCA-related cancer screening. This  may be done if you have a family history of breast, ovarian, tubal, or peritoneal cancers.  Pelvic exam and Pap test. This may be done every 3 years starting at age 65. Starting at age 36, this may be done every 5 years if you have a Pap test in combination with an HPV test. Other tests  Sexually transmitted disease (STD) testing.  Bone density scan. This is done to screen for osteoporosis. You may have this scan if you are at high risk for osteoporosis. Follow these instructions at home: Eating and drinking  Eat a diet that includes fresh fruits and vegetables, whole grains, lean protein, and low-fat dairy.  Take vitamin and mineral supplements as recommended by your health care provider.  Do not drink alcohol if: ? Your health care provider tells you not to drink. ? You are pregnant, may be pregnant, or are planning to become pregnant.  If you drink alcohol: ? Limit how much you have to 0-1 drink a day. ? Be aware of how much alcohol is in your drink. In the [place] one drink equals one 12 oz bottle of beer (355 mL), one 5 oz glass of wine (148 mL), or one 1 oz glass of hard liquor (44 mL). Lifestyle  Take daily care of your teeth and gums.  Stay active. Exercise for at least 30 minutes on 5 or more days each week.  Do not use any products that contain nicotine or tobacco, such as cigarettes, e-cigarettes, and chewing tobacco. If you need help quitting, ask your health care provider.  If you are sexually active, practice safe sex. Use a condom or other form of birth control (contraception) in order to prevent pregnancy and STIs (sexually transmitted infections).  If told by your health care provider, take low-dose aspirin daily starting at age 57. What's next?  Visit your health care provider once a year for a well check visit.  Ask your health care provider how often you should have your eyes and teeth checked.  Stay up to date on all vaccines. This information is not  intended to replace advice given to you by your health care provider. Make sure you discuss any questions you have with your health care provider. Document Revised: 07/25/2018 Document Reviewed: 07/25/2018 Elsevier Patient Education  2020 [employer].

## 2020-07-21 ENCOUNTER — Other Ambulatory Visit: Payer: Self-pay | Admitting: Pulmonary Disease

## 2020-07-21 DIAGNOSIS — J4541 Moderate persistent asthma with (acute) exacerbation: Secondary | ICD-10-CM

## 2020-07-22 ENCOUNTER — Telehealth: Payer: Self-pay | Admitting: Pulmonary Disease

## 2020-07-22 DIAGNOSIS — J4541 Moderate persistent asthma with (acute) exacerbation: Secondary | ICD-10-CM

## 2020-07-22 NOTE — Telephone Encounter (Signed)
Called patient to confirm medication she needed refill on. She states that she has already received call from pharmacy letting her know that it was ready. Nothing further needed at this time.

## 2020-08-10 ENCOUNTER — Encounter: Payer: Self-pay | Admitting: Pulmonary Disease

## 2020-08-10 ENCOUNTER — Other Ambulatory Visit: Payer: Self-pay

## 2020-08-10 ENCOUNTER — Ambulatory Visit: Payer: BC Managed Care – PPO | Admitting: Pulmonary Disease

## 2020-08-10 VITALS — BP 128/70 | HR 85 | Temp 97.0°F | Ht 64.0 in | Wt 135.6 lb

## 2020-08-10 DIAGNOSIS — J4541 Moderate persistent asthma with (acute) exacerbation: Secondary | ICD-10-CM

## 2020-08-10 NOTE — Patient Instructions (Signed)
I am glad you are doing well with regard to your breathing Continue the [facility] as prescribed Follow-up in 1 year

## 2020-08-10 NOTE — Progress Notes (Signed)
Tiffany Cline    [id number]    05-26-1961  Primary Care Physician:O'Sullivan, Lenna Sciara, NP  Referring Physician: Harrison Mons, [place],  [place] [id number]  Chief complaint:  Follow up for Moderate persistent asthma  HPI: Mrs. Blane is a 59 year old with past medical history of asthma, hypertension. She was diagnosed around 2011 and has been maintained on Advair and more recently [facility]. She has good control for most year but does have worsening of symptoms in wintertime. She has seasonal allergies, occasional postnasal drip. She denies any heartburn symptoms. She is allergic to cats. She has a dog at home but is not sensitive to it. 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  2018-Dulera reduced to 100 but had to go back up due to increased dyspnea, wheezing  Interim History: Continues on Dulera 200.  She is doing well this year Feels that wearing a mask definitely helps with her asthma symptoms Continues to use her rescue inhaler  ACQ-6 08/11/19- 0.5             Outpatient Encounter Medications as of 08/10/2020  Medication Sig  . amLODipine (NORVASC) 5 MG tablet TAKE 1 TABLET BY MOUTH DAILY  . azelastine (ASTELIN) 0.1 % nasal spray USE 2 SPRAY(S) IN EACH NOSTRIL TWICE DAILY AS DIRECTED  . cyclobenzaprine (FLEXERIL) 10 MG tablet Take 1 tablet (10 mg total) by mouth 3 (three) times daily as needed for muscle spasms.  . DULERA 200-5 MCG/ACT AERO Inhale 2 puffs by mouth twice daily  . fluticasone (FLONASE) 50 MCG/ACT nasal spray USE TWO SPRAY(S) IN EACH NOSTRIL ONCE DAILY  . mometasone-formoterol (DULERA) 200-5 MCG/ACT AERO Inhale 2 puffs into the lungs 2 (two) times daily.  . montelukast (SINGULAIR) 10 MG tablet TAKE 1 TABLET BY MOUTH AT BEDTIME  . Norethindrone-Ethinyl Estradiol-Fe Biphas (LO LOESTRIN FE) 1 MG-10 MCG / 10 MCG tablet Take 1 tablet daily  . PROAIR HFA 108 (90 Base) MCG/ACT  inhaler INHALE 2 PUFFS BY MOUTH EVERY 4 HOURS AS NEEDED FOR WHEEZING, COUGH OR SHORTNESS OF BREATH  . promethazine (PHENERGAN) 25 MG tablet Take 1 tablet (25 mg total) by mouth every 8 (eight) hours as needed for nausea or vomiting.  . SUMAtriptan (IMITREX) 50 MG tablet May repeat in 2 hours if headache persists or recurs.  Marland Kitchen UNABLE TO FIND Nebulizer machine. Dx code J45.31   Facility-Administered Encounter Medications as of 08/10/2020  Medication  . ipratropium (ATROVENT) nebulizer solution 0.5 mg    Physical Exam: Blood pressure 120/80, pulse 82, temperature (!) 96.4 F (35.8 C), temperature source Temporal, height 5\' 4"  (1.626 m), weight 140 lb 6.4 oz (63.7 kg), SpO2 98 %. Gen:      No acute distress HEENT:  EOMI, sclera anicteric Neck:     No masses; no thyromegaly Lungs:    Clear to auscultation bilaterally; normal respiratory effort CV:         Regular rate and rhythm; no murmurs Abd:      + bowel sounds; soft, non-tender; no palpable masses, no distension Ext:    No edema; adequate peripheral perfusion Skin:      Warm and dry; no rash Neuro: alert and oriented x 3 Psych: normal mood and affect  Data Reviewed: Imaging Chest x-ray 04/12/16- Mild rt apical scarring. Chest x-ray 08/09/18- mild hyperinflation with stable linear scarring. I have reviewed the images personally.  PFTs  06/02/15 FVC 3.02 [110%), FEV1 2.51 (115%), F/F 83 Normal spirometry  FENO 06/15/16-10  Labs CBC with diff 06/15/16. WBC 5.9, absolute eosinophil count- 112 Blood allergy profile- 06/15/16- Negative, IgE 42  Act score 9/14/202111-24  Assessment:  Moderate persistent asthma She has not tolerated a down titration in Dulera inhaler in past.  Symptoms are stable on a dose of 200/50. She'll continue on albuterol inhaler as needed  Sinusitiis, Allergic rhinitis Treating with Singulair, Flonase as needed  Abnormal CXR Chest x-ray shows mild scarring is stable. We'll continue to monitor. Further  evaluation with a CT scan was discussed at previous visit but she declined.  Plan/Recommendations: - Continue dulera to 200/50 - Continue Singulair, Flonase  Follow-up in 1 year                                                                                                   Marshell Garfinkel MD [facility] 08/10/2020, 10:17 AM  CC: Harrison Mons, PA

## 2020-08-19 ENCOUNTER — Encounter: Payer: Self-pay | Admitting: Family

## 2020-08-23 ENCOUNTER — Other Ambulatory Visit: Payer: Self-pay | Admitting: Family

## 2020-10-17 ENCOUNTER — Other Ambulatory Visit: Payer: Self-pay | Admitting: Pulmonary Disease

## 2020-10-17 DIAGNOSIS — J4541 Moderate persistent asthma with (acute) exacerbation: Secondary | ICD-10-CM

## 2020-10-29 ENCOUNTER — Other Ambulatory Visit: Payer: Self-pay | Admitting: Obstetrics & Gynecology

## 2020-10-29 DIAGNOSIS — Z1231 Encounter for screening mammogram for malignant neoplasm of breast: Secondary | ICD-10-CM

## 2020-11-14 ENCOUNTER — Other Ambulatory Visit: Payer: Self-pay | Admitting: Pulmonary Disease

## 2020-11-14 ENCOUNTER — Other Ambulatory Visit: Payer: Self-pay | Admitting: Family

## 2020-11-14 DIAGNOSIS — I10 Essential (primary) hypertension: Secondary | ICD-10-CM

## 2020-12-06 ENCOUNTER — Other Ambulatory Visit (HOSPITAL_BASED_OUTPATIENT_CLINIC_OR_DEPARTMENT_OTHER): Payer: Self-pay | Admitting: Family

## 2020-12-06 DIAGNOSIS — Z1231 Encounter for screening mammogram for malignant neoplasm of breast: Secondary | ICD-10-CM

## 2020-12-09 ENCOUNTER — Other Ambulatory Visit: Payer: Self-pay

## 2020-12-10 ENCOUNTER — Ambulatory Visit: Payer: BC Managed Care – PPO | Admitting: Family

## 2020-12-10 ENCOUNTER — Telehealth: Payer: Self-pay | Admitting: Family

## 2020-12-10 ENCOUNTER — Encounter: Payer: Self-pay | Admitting: Family

## 2020-12-10 ENCOUNTER — Ambulatory Visit: Payer: BC Managed Care – PPO

## 2020-12-10 ENCOUNTER — Other Ambulatory Visit: Payer: Self-pay

## 2020-12-10 ENCOUNTER — Inpatient Hospital Stay (HOSPITAL_BASED_OUTPATIENT_CLINIC_OR_DEPARTMENT_OTHER): Admission: RE | Admit: 2020-12-10 | Payer: BC Managed Care – PPO | Source: Ambulatory Visit

## 2020-12-10 VITALS — BP 140/62 | HR 90 | Temp 98.5°F | Resp 18 | Wt 129.8 lb

## 2020-12-10 DIAGNOSIS — R195 Other fecal abnormalities: Secondary | ICD-10-CM | POA: Diagnosis not present

## 2020-12-10 DIAGNOSIS — I10 Essential (primary) hypertension: Secondary | ICD-10-CM | POA: Diagnosis not present

## 2020-12-10 DIAGNOSIS — G4700 Insomnia, unspecified: Secondary | ICD-10-CM | POA: Diagnosis not present

## 2020-12-10 DIAGNOSIS — J309 Allergic rhinitis, unspecified: Secondary | ICD-10-CM

## 2020-12-10 DIAGNOSIS — J301 Allergic rhinitis due to pollen: Secondary | ICD-10-CM | POA: Diagnosis not present

## 2020-12-10 DIAGNOSIS — G43909 Migraine, unspecified, not intractable, without status migrainosus: Secondary | ICD-10-CM

## 2020-12-10 MED ORDER — SUMATRIPTAN SUCCINATE 50 MG PO TABS: ORAL_TABLET | ORAL | 5 refills | Status: DC

## 2020-12-10 MED ORDER — AMLODIPINE BESYLATE 5 MG PO TABS: ORAL_TABLET | ORAL | 1 refills | Status: DC

## 2020-12-10 NOTE — Telephone Encounter (Addendum)
Please advise pt that I did review the results of her cologuard test from 2018 and was reminded that it was positive. It looks like her previous PCP and myself both placed referrals for GI and it looks like they were unable to get in touch with her to schedule. I will replace the referral for GI. We should not repeat her cologuard but instead it is extremely important that she follow through with colonoscopy to make sure that she does not have colon cancer.

## 2020-12-10 NOTE — Telephone Encounter (Signed)
Patient advised of providers recommendations from 2018, she reports she did not see a GI doctor. Patient was informed of new referral and the urgency of her following up with Gastroenterology. She verbalized understanding.

## 2020-12-10 NOTE — Progress Notes (Signed)
Subjective:    Patient ID: Tiffany Cline, female    DOB: [birth year], 60 y.o.   MRN: [id number]  HPI  Patient is a 60 year old female who presents today for follow-up.  Reports that she is followed by GYN. She sleeps 4 hrs and then wakes up. Exercising regularly.  Wt Readings from Last 3 Encounters:  12/10/20 129 lb 12.8 oz (58.9 kg)  08/10/20 135 lb 9.6 oz (61.5 kg)  06/08/20 137 lb (62.1 kg)     HTN-  She is maintained on amlodipine 5 mg once daily.  She does not check BP at home.   BP Readings from Last 3 Encounters:  12/10/20 (!) 154/79  08/10/20 128/70  06/08/20 126/80   Migraines- uses imitrex on a prn basis. Reports a few migraines- not severe.  Allergic rhinitis- on singulair and azelastine 2 sprays bid.  Some sneezing. Rare use of claritin.    Asthma is well controlled and is managed by pulmonology- Dr. Vaughan Browner.   Review of Systems    see HPI  Past Medical History:  Diagnosis Date  . Asthma    induced by cold and allergies  . Hypertension   . Leiomyoma      Social History   Socioeconomic History  . Marital status: Single    Spouse name: n/a  . Number of children: 0  . Years of education: RPh  . Highest education level: Not on file  Occupational History  . Occupation: Pharmacist  Tobacco Use  . Smoking status: Never Smoker  . Smokeless tobacco: Never Used  Vaping Use  . Vaping Use: Never used  Substance and Sexual Activity  . Alcohol use: No  . Drug use: No  . Sexual activity: Not Currently    Birth control/protection: Pill  Other Topics Concern  . Not on file  Social History Narrative   Lives alone with her pets. (2 dogs)   Family lives nearby.   Works part time as a Software engineer at [employer]   Enjoys travelling   Investment banker, operational of Sales executive: Not on [employer] Insecurity: Not on file  Transportation Needs: Not on file  Physical Activity: Not on file  Stress: Not on file  Social Connections: Not on file   Intimate Partner Violence: Not on file    Past Surgical History:  Procedure Laterality Date  . MYOMECTOMY  2001    Family History  Problem Relation Age of Onset  . Diabetes Mother   . Hypertension Mother   . Hypertension Father   . Cancer Father        prostate (15 yrs ago)  . Thyroid disease Father        benign tumor  . Congestive Heart Failure Father   . Heart disease Maternal Grandmother        50's  . Hypertension Paternal Grandmother   . Asthma Paternal Grandfather   . Congestive Heart Failure Half-Brother   . CAD Maternal Aunt        50's  . CAD Maternal Aunt        50's    Allergies  Allergen Reactions  . Septra [Sulfamethoxazole-Trimethoprim] Shortness Of Breath and Rash  . Latex Itching and Rash    Itching and redness with rash with use of latex gloves when she had blood drawn  . Sulfa Antibiotics Rash    Current Outpatient Medications on File Prior to Visit  Medication Sig Dispense Refill  . amLODipine (NORVASC) 5 MG tablet Take  1 tablet by mouth once daily 90 tablet 0  . Azelastine HCl 137 MCG/SPRAY SOLN Place 2 sprays into the nose 2 (two) times daily as needed. 30 mL 12  . cyclobenzaprine (FLEXERIL) 10 MG tablet Take 1 tablet (10 mg total) by mouth 3 (three) times daily as needed for muscle spasms. 30 tablet 0  . DULERA 200-5 MCG/ACT AERO Inhale 2 puffs by mouth twice daily 13 g 6  . DULERA 200-5 MCG/ACT AERO Inhale 2 puffs by mouth twice daily 13 g 5  . fluticasone (FLONASE) 50 MCG/ACT nasal spray USE TWO SPRAY(S) IN EACH NOSTRIL ONCE DAILY 16 g 11  . montelukast (SINGULAIR) 10 MG tablet TAKE 1 TABLET BY MOUTH AT BEDTIME 90 tablet 1  . Norethindrone-Ethinyl Estradiol-Fe Biphas (LO LOESTRIN FE) 1 MG-10 MCG / 10 MCG tablet Take 1 tablet daily 3 Package 4  . PROAIR HFA 108 (90 Base) MCG/ACT inhaler INHALE 2 PUFFS BY MOUTH EVERY 4 HOURS AS NEEDED FOR WHEEZING, COUGH OR SHORTNESS OF BREATH 18 g 2  . promethazine (PHENERGAN) 25 MG tablet Take 1 tablet (25 mg  total) by mouth every 8 (eight) hours as needed for nausea or vomiting. 20 tablet 0  . SUMAtriptan (IMITREX) 50 MG tablet May repeat in 2 hours if headache persists or recurs. 10 tablet 5  . UNABLE TO FIND Nebulizer machine. Dx code J45.31 1 Device 0   Current Facility-Administered Medications on File Prior to Visit  Medication Dose Route Frequency Provider Last Rate Last Admin  . ipratropium (ATROVENT) nebulizer solution 0.5 mg  0.5 mg Nebulization Once Dunn, Ryan M, PA-C        BP (!) 154/79 (BP Location: Right Arm, Patient Position: Sitting, Cuff Size: Normal)   Pulse 90   Temp 98.5 F (36.9 C) (Oral)   Resp 18   Wt 129 lb 12.8 oz (58.9 kg)   SpO2 99%   BMI 22.28 kg/m    Objective:   Physical Exam Constitutional:      Appearance: She is well-developed and well-nourished.  Cardiovascular:     Rate and Rhythm: Normal rate and regular rhythm.     Heart sounds: Normal heart sounds. No murmur heard.   Pulmonary:     Effort: Pulmonary effort is normal. No respiratory distress.     Breath sounds: Normal breath sounds. No wheezing.  Psychiatric:        Mood and Affect: Mood and affect normal.        Behavior: Behavior normal.        Thought Content: Thought content normal.        Judgment: Judgment normal.           Assessment & Plan:  HTN- initial bp was mildly elevated. Follow up bp is improved. Will continue amlodipine 5mg  once daily and plan to recheck her bp in 3 months. She has been working hard on diet/exercise and weight loss.  Allergic rhinitis- stable on azelastine and singulair 10mg . Continue same.   Migraines- stable. Continue imitrex 50mg  PO PRN.  Insomnia- discussed avoiding screens for 1.5 hrs prior to bedtime in the evening.   Hx of + cologuard- will refer for colonoscopy- see telephone note 1/14 for details.  This visit occurred during the SARS-CoV-2 public health emergency.  Safety protocols were in place, including screening questions prior to the  visit, additional usage of staff PPE, and extensive cleaning of exam room while observing appropriate contact time as indicated for disinfecting solutions.

## 2020-12-11 ENCOUNTER — Other Ambulatory Visit: Payer: Self-pay | Admitting: Family

## 2020-12-11 DIAGNOSIS — I10 Essential (primary) hypertension: Secondary | ICD-10-CM

## 2020-12-13 ENCOUNTER — Ambulatory Visit: Payer: BC Managed Care – PPO | Admitting: Family

## 2020-12-21 ENCOUNTER — Other Ambulatory Visit: Payer: Self-pay

## 2020-12-21 ENCOUNTER — Ambulatory Visit (HOSPITAL_BASED_OUTPATIENT_CLINIC_OR_DEPARTMENT_OTHER)
Admission: RE | Admit: 2020-12-21 | Discharge: 2020-12-21 | Disposition: A | Discharge status: Home / Self Care | Payer: BC Managed Care – PPO | Source: Ambulatory Visit | Attending: Family | Admitting: Family

## 2020-12-21 DIAGNOSIS — Z1231 Encounter for screening mammogram for malignant neoplasm of breast: Secondary | ICD-10-CM

## 2020-12-22 ENCOUNTER — Encounter: Payer: BC Managed Care – PPO | Admitting: Nurse Practitioner

## 2020-12-22 ENCOUNTER — Ambulatory Visit (INDEPENDENT_AMBULATORY_CARE_PROVIDER_SITE_OTHER): Payer: BC Managed Care – PPO | Admitting: Obstetrics and Gynecology

## 2020-12-22 ENCOUNTER — Encounter: Payer: Self-pay | Admitting: Obstetrics and Gynecology

## 2020-12-22 VITALS — BP 128/78 | Ht 64.0 in | Wt 128.0 lb

## 2020-12-22 DIAGNOSIS — Z01419 Encounter for gynecological examination (general) (routine) without abnormal findings: Secondary | ICD-10-CM | POA: Diagnosis not present

## 2020-12-22 DIAGNOSIS — N951 Menopausal and female climacteric states: Secondary | ICD-10-CM

## 2020-12-22 NOTE — Progress Notes (Signed)
Pegge Cumberledge 06/05/1961 [id number]  SUBJECTIVE:  60 y.o. G0P0000 female for annual routine gynecologic exam. She has no gynecologic concerns.  Has remained on Lo-Loestrin but has been amenorrheic now in the past several months.  Brinson last year while she was on the pill was in the premenopausal range.  She is a Software engineer.  Current Outpatient Medications  Medication Sig Dispense Refill  . amLODipine (NORVASC) 5 MG tablet Take 1 tablet by mouth once daily 90 tablet 0  . Azelastine HCl 137 MCG/SPRAY SOLN Place 2 sprays into the nose 2 (two) times daily as needed. 30 mL 12  . cyclobenzaprine (FLEXERIL) 10 MG tablet Take 1 tablet (10 mg total) by mouth 3 (three) times daily as needed for muscle spasms. 30 tablet 0  . DULERA 200-5 MCG/ACT AERO Inhale 2 puffs by mouth twice daily 13 g 5  . fluticasone (FLONASE) 50 MCG/ACT nasal spray USE TWO SPRAY(S) IN EACH NOSTRIL ONCE DAILY 16 g 11  . montelukast (SINGULAIR) 10 MG tablet TAKE 1 TABLET BY MOUTH AT BEDTIME 90 tablet 1  . Norethindrone-Ethinyl Estradiol-Fe Biphas (LO LOESTRIN FE) 1 MG-10 MCG / 10 MCG tablet Take 1 tablet daily 3 Package 4  . PROAIR HFA 108 (90 Base) MCG/ACT inhaler INHALE 2 PUFFS BY MOUTH EVERY 4 HOURS AS NEEDED FOR WHEEZING, COUGH OR SHORTNESS OF BREATH 18 g 2  . promethazine (PHENERGAN) 25 MG tablet Take 1 tablet (25 mg total) by mouth every 8 (eight) hours as needed for nausea or vomiting. 20 tablet 0  . SUMAtriptan (IMITREX) 50 MG tablet May repeat in 2 hours if headache persists or recurs. 10 tablet 5   Current Facility-Administered Medications  Medication Dose Route Frequency Provider Last Rate Last Admin  . ipratropium (ATROVENT) nebulizer solution 0.5 mg  0.5 mg Nebulization Once Dunn, Ryan M, PA-C       Allergies: Septra [sulfamethoxazole-trimethoprim], Latex, and Sulfa antibiotics  Patient's last menstrual period was 07/22/2020 (lmp unknown).  Past medical history,surgical history, problem list, medications, allergies,  family history and social history were all reviewed and documented as reviewed in the EPIC chart.  ROS: Pertinent positives and negatives as reviewed in HPI   OBJECTIVE:  BP 128/78 (BP Location: Right Arm, Patient Position: Sitting, Cuff Size: Normal)   Ht 5\' 4"  (1.626 m)   Wt 128 lb (58.1 kg)   LMP 07/22/2020 (LMP Unknown)   BMI 21.97 kg/m  The patient appears well, alert, oriented, in no distress. Lungs are clear, good air entry, no wheezes, rhonchi or rales. S1 and S2 normal, no murmurs, regular rate and rhythm.  Abdomen soft without tenderness, guarding, mass or organomegaly.  Neurological is normal, no focal findings.  BREAST EXAM: breasts appear normal, no suspicious masses, no skin or nipple changes or axillary nodes, chronically inverted left nipple noted  PELVIC EXAM: VULVA: normal appearing vulva with no masses, tenderness or lesions, VAGINA: normal appearing vagina with normal color and discharge, no lesions, CERVIX: normal appearing cervix without discharge or lesions, UTERUS: uterus is normal size, shape, consistency and nontender, ADNEXA: normal adnexa in size, nontender and no masses  Chaperone: Wandra Scot Bonham present during the examination  ASSESSMENT:  60 y.o. G0P0000 here for annual gynecologic exam  PLAN:   1. Questionable menopausal status.  Managing vasomotor symptoms okay with OTC supplement.  She has been continuing to take Lo Loestrin but now has been amenorrheic.  Suggested that she consider discontinuing off of it and then seeing if she reverts back to  having spontaneous menses or not.  When she has been off of the pill for a few months we could check an [facility] level which would give a more accurate depiction of her menopausal status.  Indicates her mother went through menopause in her late 16s-early 60s.  She indicates she does not need contraception.  Also sounds like history of menstrual migraines which should get better into menopause.  Will let us know if  menopausal symptoms are not manageable when off of the pill. 2. Pap smear/HPV 11/2018.  No reported history of abnormal Pap smears.  Next Pap smear due 2025 following the current guidelines recommending the 5 year interval. 3. Mammogram 11/2020.  Normal breast exam today.  Continue with annual mammograms. 4.  History of fibroid uterus.  No active symptoms or pelvic pain.  Recommend continuing annual pelvic exam for monitoring. 5.  Cologuard in 2018.  I suggested that with a history of occasional rectal bleeding that she should move forward with getting a screening colonoscopy.  She acknowledges the recommendation.  He was in contact available upon request at checkout. 6. DEXA never.  Plan when around age 57. 96. Health maintenance.  No labs today as she normally has these completed with her primary care provider. The patient is aware that I will only be at this practice until early March 2022 so she knows to make sure she requests follow-up on any results if any testing is completed when I am no longer at the practice.  Return annually or sooner, prn.  Joseph Pierini MD 12/22/20

## 2021-03-18 ENCOUNTER — Other Ambulatory Visit: Payer: Self-pay

## 2021-03-18 ENCOUNTER — Telehealth: Payer: Self-pay | Admitting: Family

## 2021-03-18 ENCOUNTER — Encounter: Payer: Self-pay | Admitting: Family

## 2021-03-18 ENCOUNTER — Telehealth: Payer: Self-pay | Admitting: Pulmonary Disease

## 2021-03-18 ENCOUNTER — Ambulatory Visit (INDEPENDENT_AMBULATORY_CARE_PROVIDER_SITE_OTHER): Payer: Managed Care, Other (non HMO) | Admitting: Family

## 2021-03-18 VITALS — BP 134/74 | HR 74 | Temp 98.9°F | Resp 16 | Wt 130.8 lb

## 2021-03-18 DIAGNOSIS — I10 Essential (primary) hypertension: Secondary | ICD-10-CM | POA: Diagnosis not present

## 2021-03-18 DIAGNOSIS — G43909 Migraine, unspecified, not intractable, without status migrainosus: Secondary | ICD-10-CM | POA: Insufficient documentation

## 2021-03-18 DIAGNOSIS — J4520 Mild intermittent asthma, uncomplicated: Secondary | ICD-10-CM | POA: Diagnosis not present

## 2021-03-18 DIAGNOSIS — R195 Other fecal abnormalities: Secondary | ICD-10-CM

## 2021-03-18 DIAGNOSIS — Z0000 Encounter for general adult medical examination without abnormal findings: Secondary | ICD-10-CM

## 2021-03-18 DIAGNOSIS — J4540 Moderate persistent asthma, uncomplicated: Secondary | ICD-10-CM

## 2021-03-18 MED ORDER — PROMETHAZINE HCL 25 MG PO TABS: 25.0000 mg | ORAL_TABLET | Freq: Three times a day (TID) | ORAL | 0 refills | Status: AC | PRN

## 2021-03-18 MED ORDER — FLUTICASONE-SALMETEROL 250-50 MCG/DOSE IN AEPB: 1.0000 | INHALATION_SPRAY | Freq: Two times a day (BID) | RESPIRATORY_TRACT | 3 refills | Status: DC

## 2021-03-18 MED ORDER — AMLODIPINE BESYLATE 5 MG PO TABS: 1.0000 | ORAL_TABLET | Freq: Every day | ORAL | 1 refills | Status: DC

## 2021-03-18 MED ORDER — BUDESONIDE-FORMOTEROL FUMARATE 160-4.5 MCG/ACT IN AERO: 2.0000 | INHALATION_SPRAY | Freq: Two times a day (BID) | RESPIRATORY_TRACT | 3 refills | Status: DC

## 2021-03-18 NOTE — Telephone Encounter (Signed)
Pt calling, states she has been using Dulera as long acting inhaler, changed insurances, Ruthe Mannan is now $400. PCP sent in Spiriva, also $400. Preferred medications are: Trelegy Anoro Stiolto If Dr Vaughan Browner could please send in one of these for her, that'd be great.  Pharmacy- [facility] [address] in [place]. Please advise [phone]

## 2021-03-18 NOTE — Assessment & Plan Note (Signed)
Stable. Continue singulair and azelastine spray.

## 2021-03-18 NOTE — Addendum Note (Signed)
Addended by: Jiles Prows on: 03/18/2021 01:01 PM   Modules accepted: Orders

## 2021-03-18 NOTE — Telephone Encounter (Signed)
Called and spoke with patient, verified information that had been provided regarding the Dulera and Spiriva inhalers as well as the inhalers that are on the preferred of her insurance company.  Advised I would get the list to Dr. Vaughan Browner and once he chooses an inhaler, we will send it to her pharmacy and contact her as well.  She verbalized understanding.  Dr. Vaughan Browner, please advise which inhaler on the patient's insurance preferred list you would like sent to her pharmacy:  Trelegy, Anoro, Stiolto.  Thank you.

## 2021-03-18 NOTE — Assessment & Plan Note (Signed)
BP is stable. Continue amlodipine 5mg once daily.  

## 2021-03-18 NOTE — Assessment & Plan Note (Signed)
Pt did not schedule colonoscopy. Discussed importance of colonoscopy. Will refer to GI for colonoscopy again.

## 2021-03-18 NOTE — Telephone Encounter (Signed)
Off all the preferred alternatives to most appropriate would be Trelegy 200.  Please order

## 2021-03-18 NOTE — Assessment & Plan Note (Signed)
Currently having about 2 migraines a month. Using imitrex prn which helps her migraines. Continue same.

## 2021-03-18 NOTE — Progress Notes (Signed)
Subjective:   By signing my name below, I, [name], attest that this documentation has been prepared under the direction and in the presence of Debbrah Alar, NP. 03/18/2021     Patient ID: Tiffany Cline, adult    DOB: 04-29-1961, 60 y.o.   MRN: [id number]  No chief complaint on file.   HPI Patient is in today for a office visit. She reports she has stopped taking her birth control pill. She notes that she does not experience anymore vaginal bleeding, but she has been feeling occasionally sore.   Migraines-She is taking 50 mg Imitrex daily PO to help manage her migraine symptoms and finds mild relief. She is taking 25 mg Phenergan to help manage her nausea during her migraines episodes.  Hypertension- She is taking 5 mg amlodipine daily PO to manage her hypertension. Asthma- She reports that she has been managing her asthma well. She is requesting to switch her current asthma medication because of the increase in price.  Allergies- She is taking Flonase twice a day to help manage her allergy symptoms.  Past Medical History:  Diagnosis Date  . Asthma    induced by cold and allergies  . Hypertension   . Leiomyoma     Past Surgical History:  Procedure Laterality Date  . MYOMECTOMY  2001    Family History  Problem Relation Age of Onset  . Diabetes Mother   . Hypertension Mother   . Hypertension Father   . Cancer Father        prostate (15 yrs ago)  . Thyroid disease Father        benign tumor  . Congestive Heart Failure Father        died from fall/head trauma at age 48  . Heart disease Maternal Grandmother        50's  . Hypertension Paternal Grandmother   . Asthma Paternal Grandfather   . Congestive Heart Failure Half-Brother   . CAD Maternal Aunt        50's  . CAD Maternal Aunt        50's    Social History   Socioeconomic History  . Marital status: Single    Spouse name: n/a  . Number of children: 0  . Years of education: RPh  . Highest  education level: Not on file  Occupational History  . Occupation: Pharmacist  Tobacco Use  . Smoking status: Never Smoker  . Smokeless tobacco: Never Used  Vaping Use  . Vaping Use: Never used  Substance and Sexual Activity  . Alcohol use: No  . Drug use: No  . Sexual activity: Not Currently    Birth control/protection: Pill  Other Topics Concern  . Not on file  Social History Narrative   Lives alone with her pets. (2 dogs)   Family lives nearby.   Works part time as a Software engineer at [employer]   Enjoys travelling   Investment banker, operational of Sales executive: Not on [employer] Insecurity: Not on file  Transportation Needs: Not on file  Physical Activity: Not on file  Stress: Not on file  Social Connections: Not on file  Intimate Partner Violence: Not on file    Outpatient Medications Prior to Visit  Medication Sig Dispense Refill  . amLODipine (NORVASC) 5 MG tablet Take 1 tablet by mouth once daily 90 tablet 0  . Azelastine HCl 137 MCG/SPRAY SOLN Place 2 sprays into the nose 2 (two) times daily as needed. [place]  mL 12  . cyclobenzaprine (FLEXERIL) 10 MG tablet Take 1 tablet (10 mg total) by mouth 3 (three) times daily as needed for muscle spasms. 30 tablet 0  . DULERA 200-5 MCG/ACT AERO Inhale 2 puffs by mouth twice daily 13 g 5  . fluticasone (FLONASE) 50 MCG/ACT nasal spray USE TWO SPRAY(S) IN EACH NOSTRIL ONCE DAILY 16 g 11  . montelukast (SINGULAIR) 10 MG tablet TAKE 1 TABLET BY MOUTH AT BEDTIME 90 tablet 1  . Norethindrone-Ethinyl Estradiol-Fe Biphas (LO LOESTRIN FE) 1 MG-10 MCG / 10 MCG tablet Take 1 tablet daily 3 Package 4  . PROAIR HFA 108 (90 Base) MCG/ACT inhaler INHALE 2 PUFFS BY MOUTH EVERY 4 HOURS AS NEEDED FOR WHEEZING, COUGH OR SHORTNESS OF BREATH 18 g 2  . promethazine (PHENERGAN) 25 MG tablet Take 1 tablet (25 mg total) by mouth every 8 (eight) hours as needed for nausea or vomiting. 20 tablet 0  . SUMAtriptan (IMITREX) 50 MG tablet May repeat in 2  hours if headache persists or recurs. 10 tablet 5   Facility-Administered Medications Prior to Visit  Medication Dose Route Frequency Provider Last Rate Last Admin  . ipratropium (ATROVENT) nebulizer solution 0.5 mg  0.5 mg Nebulization Once Christell Faith M, PA-C        Allergies  Allergen Reactions  . Septra [Sulfamethoxazole-Trimethoprim] Shortness Of Breath and Rash  . Latex Itching and Rash    Itching and redness with rash with use of latex gloves when she had blood drawn  . Sulfa Antibiotics Rash    ROS    see  HPI  Objective:    Physical Exam Constitutional:      Appearance: She is well-developed.  Neck:     Thyroid: No thyromegaly.  Cardiovascular:     Rate and Rhythm: Normal rate and regular rhythm.     Pulses: Normal pulses.     Heart sounds: Normal heart sounds. No murmur heard.   Pulmonary:     Effort: Pulmonary effort is normal. No respiratory distress.     Breath sounds: Normal breath sounds. No wheezing.  Musculoskeletal:     Cervical back: Neck supple.  Skin:    General: Skin is warm and dry.  Neurological:     Mental Status: She is alert and oriented to person, place, and time.  Psychiatric:        Behavior: Behavior normal.        Thought Content: Thought content normal.        Judgment: Judgment normal.     There were no vitals taken for this visit. Wt Readings from Last 3 Encounters:  12/22/20 128 lb (58.1 kg)  12/10/20 129 lb 12.8 oz (58.9 kg)  08/10/20 135 lb 9.6 oz (61.5 kg)    Diabetic Foot Exam - Simple   No data filed    Lab Results  Component Value Date   WBC 6.8 06/08/2020   HGB 13.0 06/08/2020   HCT 39.8 06/08/2020   PLT 250.0 06/08/2020   GLUCOSE 97 06/08/2020   CHOL 179 06/08/2020   TRIG 67.0 06/08/2020   HDL 75.30 06/08/2020   LDLCALC 90 06/08/2020   ALT 20 06/08/2020   AST 16 06/08/2020   NA 138 06/08/2020   K 3.7 06/08/2020   CL 101 06/08/2020   CREATININE 0.70 06/08/2020   BUN 12 06/08/2020   CO2 31 06/08/2020    TSH 0.62 06/08/2020    Lab Results  Component Value Date   TSH 0.62 06/08/2020  Lab Results  Component Value Date   WBC 6.8 06/08/2020   HGB 13.0 06/08/2020   HCT 39.8 06/08/2020   MCV 89.0 06/08/2020   PLT 250.0 06/08/2020   Lab Results  Component Value Date   NA 138 06/08/2020   K 3.7 06/08/2020   CO2 31 06/08/2020   GLUCOSE 97 06/08/2020   BUN 12 06/08/2020   CREATININE 0.70 06/08/2020   BILITOT 0.2 06/08/2020   ALKPHOS 59 06/08/2020   AST 16 06/08/2020   ALT 20 06/08/2020   PROT 6.7 06/08/2020   ALBUMIN 3.9 06/08/2020   CALCIUM 9.2 06/08/2020   GFR 104.31 06/08/2020   Lab Results  Component Value Date   CHOL 179 06/08/2020   Lab Results  Component Value Date   HDL 75.30 06/08/2020   Lab Results  Component Value Date   LDLCALC 90 06/08/2020   Lab Results  Component Value Date   TRIG 67.0 06/08/2020   Lab Results  Component Value Date   CHOLHDL 2 06/08/2020   No results found for: HGBA1C     Assessment & Plan:   Problem List Items Addressed This Visit   None      No orders of the defined types were placed in this encounter.   I, Shehryar [place], personally preformed the services described in this documentation.  All medical record entries made by the scribe were at my direction and in my presence.  I have reviewed the chart and discharge instructions (if applicable) and agree that the record reflects my personal performance and is accurate and complete. 03/18/2021   I,[name],acting as a scribe for Nance Pear, NP.,have documented all relevant documentation on the behalf of Nance Pear, NP,as directed by  Nance Pear, NP while in the presence of Nance Pear, NP.   Shehryar Dinosaur, NP, have reviewed all documentation for this visit. The documentation on 03/18/21 for the exam, diagnosis, procedures, and orders are all accurate and complete.

## 2021-03-18 NOTE — Telephone Encounter (Signed)
Caller : Tiffany Cline  Call Back @ [phone]  Patient states insurance will not cover inhaler sent in this morning.(budesonide-formoterol (SYMBICORT).  Patient states insurance will cover    anoro  stiolto  trelegy

## 2021-03-18 NOTE — Assessment & Plan Note (Signed)
dulera is costing her >$400/month as it is non-formulary.  Will d/c dulera and switch to symbicort as symbicort is on formulary.

## 2021-03-21 MED ORDER — TRELEGY ELLIPTA 200-62.5-25 MCG/INH IN AEPB: 1.0000 | INHALATION_SPRAY | Freq: Every day | RESPIRATORY_TRACT | 11 refills | Status: DC

## 2021-03-21 NOTE — Telephone Encounter (Signed)
rx was changed last week

## 2021-03-21 NOTE — Telephone Encounter (Signed)
ATC patient unable to reach LM to call back office (x1)  

## 2021-03-21 NOTE — Telephone Encounter (Signed)
Spoke with the pt and notified of response per Dr Vaughan Browner. She verbalized understanding. Rx for Trelegy 200 was sent to pharm.

## 2021-03-29 ENCOUNTER — Encounter: Payer: Self-pay | Admitting: Family

## 2021-03-29 DIAGNOSIS — M2550 Pain in unspecified joint: Secondary | ICD-10-CM

## 2021-03-31 ENCOUNTER — Other Ambulatory Visit: Payer: Self-pay

## 2021-03-31 ENCOUNTER — Other Ambulatory Visit (INDEPENDENT_AMBULATORY_CARE_PROVIDER_SITE_OTHER): Payer: Managed Care, Other (non HMO)

## 2021-03-31 DIAGNOSIS — M2550 Pain in unspecified joint: Secondary | ICD-10-CM | POA: Diagnosis not present

## 2021-03-31 LAB — SEDIMENTATION RATE: Sed Rate: 14 mm/hr (ref 0–30)

## 2021-04-03 LAB — ANA: Anti Nuclear Antibody (ANA): NEGATIVE

## 2021-04-03 LAB — RHEUMATOID FACTOR: Rheumatoid fact SerPl-aCnc: <14 IU/mL (ref <14)

## 2021-04-05 NOTE — Addendum Note (Signed)
Addended by: Debbrah Alar on: 04/05/2021 07:32 AM   Modules accepted: Orders

## 2021-04-11 ENCOUNTER — Telehealth: Payer: Self-pay | Admitting: *Deleted

## 2021-04-11 ENCOUNTER — Encounter: Payer: Self-pay | Admitting: Family Medicine

## 2021-04-11 ENCOUNTER — Other Ambulatory Visit: Payer: Self-pay

## 2021-04-11 ENCOUNTER — Ambulatory Visit (INDEPENDENT_AMBULATORY_CARE_PROVIDER_SITE_OTHER): Payer: Managed Care, Other (non HMO) | Admitting: Family Medicine

## 2021-04-11 ENCOUNTER — Ambulatory Visit (INDEPENDENT_AMBULATORY_CARE_PROVIDER_SITE_OTHER): Payer: Managed Care, Other (non HMO)

## 2021-04-11 DIAGNOSIS — M79642 Pain in left hand: Secondary | ICD-10-CM | POA: Diagnosis not present

## 2021-04-11 DIAGNOSIS — M25561 Pain in right knee: Secondary | ICD-10-CM

## 2021-04-11 DIAGNOSIS — M25512 Pain in left shoulder: Secondary | ICD-10-CM

## 2021-04-11 DIAGNOSIS — M25562 Pain in left knee: Secondary | ICD-10-CM | POA: Diagnosis not present

## 2021-04-11 DIAGNOSIS — M25511 Pain in right shoulder: Secondary | ICD-10-CM

## 2021-04-11 DIAGNOSIS — N951 Menopausal and female climacteric states: Secondary | ICD-10-CM

## 2021-04-11 MED ORDER — MELOXICAM 15 MG PO TABS: 7.5000 mg | ORAL_TABLET | Freq: Every day | ORAL | 6 refills | Status: DC | PRN

## 2021-04-11 NOTE — Telephone Encounter (Signed)
Order placed, patient coming 04/12/21 @ 10:00am

## 2021-04-11 NOTE — Progress Notes (Signed)
Office Visit Note   Patient: Tiffany Cline           Date of Birth: 06/07/1961           MRN: [id number] Visit Date: 04/11/2021 Requested by: Debbrah Alar, NP [address] [place],  [place] [address] PCP: Debbrah Alar, NP  Subjective: Chief Complaint  Patient presents with  . Right Hand - Pain    +Stiffness  Pain since April 2022. Hurts to make a fist. Right handed. +Ibuprofen TID Pain when using hands. Recently had blood work done 03/31/2021.  Marland Kitchen Left Hand - Pain  . Right Knee - Pain  . Left Knee - Pain  . Left Shoulder - Pain  . Right Shoulder - Pain    HPI: She is here with left hand pain, bilateral shoulder pain, bilateral knee and ankle pain.  Symptoms began in April, no injury.  She notes that in January her gynecologist took her off estrogen.  Patient has had issues with uterine fibroids and the estrogen has been helping prevent bleeding from this.  Now that she has reached menopause age, her gynecologist decided to take her off hormones.  She had some withdrawal issues at first which seemed to subside.  But now in April she has developed all of these aches and pains.  Every morning she wakes up with pain and stiffness in the above-mentioned joints.  The left hand bothers her the most.  After a few hours her symptoms improved but never gone away completely.  Meloxicam helps to some degree.  She had rheumatoid factor, ANA and sed rate drawn recently which were all normal.  No family history of rheumatologic disease.  She has otherwise been in good health.  No other changes in her medications.                ROS:   All other systems were reviewed and are negative.  Objective: Vital Signs: There were no vitals taken for this visit.  Physical Exam:  General:  Alert and oriented, in no acute distress. Pulm:  Breathing unlabored. Psy:  Normal mood, congruent affect. Skin: No rash or erythema Shoulders: Full active range of motion.  She has some  tenderness at the right [facility] joint and bilateral trapezius muscles. Left hand: No joint effusions.  Slightly tender to palpation of the index MCP joint.  Also tender at the thumb MCP but not the CMC joints.  Wynn Maudlin test is negative. Knees: No effusions bilaterally, trace patellofemoral crepitus bilaterally and full range of motion. Ankles:  No effusions.   Imaging: XR Hand Complete Left  Result Date: 04/11/2021 X-rays of the left hand reveal well-preserved joint spaces, no erosions, overall normal-appearing hand.   Assessment & Plan: 1.  Left hand and right hand pain and multiple joint pains, etiology uncertain.  Seems like an inflammatory reaction of some sort, but recent labs were unremarkable.  Cannot rule out pain from hormone imbalance related to stopping estrogen. -She will try meloxicam as needed.  She will contact her gynecologist to discuss checking hormone levels. -Could do additional rheumatologic work-up if symptoms persist.     Procedures: No procedures performed        PMFS History: Patient Active Problem List   Diagnosis Date Noted  . Migraines 03/18/2021  . Positive colorectal cancer screening using Cologuard test 03/13/2018  . Mild intermittent asthma without complication 83/15/1761  . Abnormal CXR 04/12/2016  . Asthma, chronic 06/30/2014  . Essential hypertension 05/27/2013  .  Allergic rhinitis 05/27/2013   Past Medical History:  Diagnosis Date  . Asthma    induced by cold and allergies  . Hypertension   . Leiomyoma     Family History  Problem Relation Age of Onset  . Diabetes Mother   . Hypertension Mother   . Hypertension Father   . Cancer Father        prostate (15 yrs ago)  . Thyroid disease Father        benign tumor  . Congestive Heart Failure Father        died from fall/head trauma at age 70  . Heart disease Maternal Grandmother        50's  . Hypertension Paternal Grandmother   . Asthma Paternal Grandfather   . Congestive Heart  Failure Half-Brother   . CAD Maternal Aunt        50's  . CAD Maternal Aunt        50's    Past Surgical History:  Procedure Laterality Date  . MYOMECTOMY  2001   Social History   Occupational History  . Occupation: Pharmacist  Tobacco Use  . Smoking status: Never Smoker  . Smokeless tobacco: Never Used  Vaping Use  . Vaping Use: Never used  Substance and Sexual Activity  . Alcohol use: No  . Drug use: No  . Sexual activity: Not Currently    Birth control/protection: Pill

## 2021-04-11 NOTE — Telephone Encounter (Signed)
Yes, that's fine. Thank you! 

## 2021-04-11 NOTE — Telephone Encounter (Signed)
Former patient of Dr.Kendall per note "When she has been off of the pill for a few months we could check an [facility] level which would give a more accurate depiction of her menopausal status."   Patient would like to come have this drawn, okay to place order under your name?

## 2021-04-12 ENCOUNTER — Other Ambulatory Visit: Payer: Self-pay

## 2021-04-12 DIAGNOSIS — N951 Menopausal and female climacteric states: Secondary | ICD-10-CM

## 2021-04-13 ENCOUNTER — Encounter: Payer: Self-pay | Admitting: Family Medicine

## 2021-04-13 DIAGNOSIS — Z780 Asymptomatic menopausal state: Secondary | ICD-10-CM

## 2021-04-13 LAB — FOLLICLE STIMULATING HORMONE: FSH: 113.5 m[IU]/mL

## 2021-04-16 ENCOUNTER — Other Ambulatory Visit: Payer: Self-pay | Admitting: Pulmonary Disease

## 2021-04-16 DIAGNOSIS — J4541 Moderate persistent asthma with (acute) exacerbation: Secondary | ICD-10-CM

## 2021-04-18 ENCOUNTER — Other Ambulatory Visit: Payer: Self-pay

## 2021-04-18 ENCOUNTER — Other Ambulatory Visit: Payer: Self-pay | Admitting: *Deleted

## 2021-04-18 DIAGNOSIS — J4541 Moderate persistent asthma with (acute) exacerbation: Secondary | ICD-10-CM

## 2021-04-18 MED ORDER — MONTELUKAST SODIUM 10 MG PO TABS: 1.0000 | ORAL_TABLET | Freq: Every day | ORAL | 2 refills | Status: DC

## 2021-04-18 NOTE — Progress Notes (Signed)
Refill pending.

## 2021-04-21 ENCOUNTER — Encounter: Payer: Self-pay | Admitting: Nurse Practitioner

## 2021-04-26 ENCOUNTER — Encounter: Payer: Self-pay | Admitting: Nurse Practitioner

## 2021-04-26 ENCOUNTER — Ambulatory Visit (INDEPENDENT_AMBULATORY_CARE_PROVIDER_SITE_OTHER): Payer: Managed Care, Other (non HMO) | Admitting: Nurse Practitioner

## 2021-04-26 ENCOUNTER — Other Ambulatory Visit: Payer: Self-pay

## 2021-04-26 VITALS — BP 118/76

## 2021-04-26 DIAGNOSIS — Z79890 Hormone replacement therapy (postmenopausal): Secondary | ICD-10-CM

## 2021-04-26 DIAGNOSIS — N951 Menopausal and female climacteric states: Secondary | ICD-10-CM

## 2021-04-26 MED ORDER — ESTRADIOL 0.5 MG PO TABS: 0.5000 mg | ORAL_TABLET | Freq: Every day | ORAL | 11 refills | Status: DC

## 2021-04-26 MED ORDER — PROGESTERONE MICRONIZED 100 MG PO CAPS: 100.0000 mg | ORAL_CAPSULE | Freq: Every day | ORAL | 11 refills | Status: DC

## 2021-04-26 NOTE — Progress Notes (Signed)
   Acute Office Visit  Subjective:    Patient ID: Tiffany Cline, female    DOB: 02-12-1961, 60 y.o.   MRN: [id number]   HPI 60 y.o. presents today for hot flashes, night sweats, and joint pain. She was on OCPs for many years and stopped taking in January after she went over a year without a cycle. [facility] 113 May 2022. She has been experiencing significant menopausal symptoms with most bothersome being her joint pain. Upon awakening she is very stiff in most joints and experiences pain. She has seen her PCP for this and was prescribed Meloxicam but she did not notice any improvement with use. Her mother does have OA.    Review of Systems  Constitutional: Negative.   Endocrine: Positive for heat intolerance.  Genitourinary: Negative.   Musculoskeletal: Positive for arthralgias. Negative for joint swelling.       Objective:    Physical Exam Constitutional:      Appearance: Normal appearance.     BP 118/76   LMP 07/22/2020 (LMP Unknown)  Wt Readings from Last 3 Encounters:  03/18/21 130 lb 12.8 oz (59.3 kg)  12/22/20 128 lb (58.1 kg)  12/10/20 129 lb 12.8 oz (58.9 kg)        Assessment & Plan:   Problem List Items Addressed This Visit   None   Visit Diagnoses    Hormone replacement therapy (HRT)    -  Primary   Relevant Medications   estradiol (ESTRACE) 0.5 MG tablet   progesterone (PROMETRIUM) 100 MG capsule   Menopausal symptoms       Relevant Medications   estradiol (ESTRACE) 0.5 MG tablet   progesterone (PROMETRIUM) 100 MG capsule     Plan: We discussed menopausal symptoms and management options available. She would like to try hormone replacement therapy. We discussed proper use, benefits of symptom management, heart and bone health, as well as the risks of blood clots, heart attack, stroke, and breast cancer. She is aware of the recommended maximum use of  7 years. Estradiol 0.5 mg and Prometrium 100 mg daily. She will keep me updated and we will adjust as  appropriate. Also recommend avoiding triggering foods/behaviors for management of hot flashes and night sweats. She is on low dose amlodipine for blood pressure management. BP today 118/76. She will keep an eye on her BP as well. All questions answered. She is agreeable to plan.      Tamela Gammon DNP, 11:29 AM 04/26/2021

## 2021-04-26 NOTE — Patient Instructions (Signed)
Menopause and Hormone Replacement Therapy Menopause is a normal time of life when menstrual periods stop completely and the ovaries stop producing the female hormones estrogen and progesterone. Low levels of these hormones can affect your health and cause symptoms. Hormone replacement therapy (HRT) can relieve some of those symptoms. HRT is the use of artificial (synthetic) hormones to replace hormones that your body has stopped producing because you have reached menopause. Types of HRT HRT may consist of the synthetic hormones estrogen and progestin, or it may consist of estrogen-only therapy. You and your health care provider will decide which form of HRT is best for you. If you choose to be on HRT and you have a uterus, estrogen and progestin are usually prescribed. Estrogen-only therapy is used for women who do not have a uterus. Possible options for taking HRT include:  Pills.  Patches.  Gels.  Sprays.  Vaginal cream.  Vaginal rings.  Vaginal inserts. The amount of hormones that you take and how long you take them varies according to your health. It is important to:  Begin HRT with the lowest possible dosage.  Stop HRT as soon as your health care provider tells you to stop.  Work with your health care provider so that you feel informed and comfortable with your decisions.   Tell a health care provider about:  Any allergies you have.  Whether you have had blood clots or know of any risk factors you may have for blood clots.  Whether you or family members have had cancer, especially cancer of the breasts, ovaries, or uterus.  Any surgeries you have had.  All medicines you are taking, including vitamins, herbs, eye drops, creams, and over-the-counter medicines.  Whether you are pregnant or may be pregnant.  Any medical conditions you have. What are the benefits? HRT can reduce the frequency and severity of menopausal symptoms. Benefits of HRT vary according to the kind  of symptoms that you have, how severe they are, and your overall health. HRT may help to improve the following symptoms of menopause:  Hot flashes and night sweats. These are sudden feelings of heat that spread over the face and body. The skin may turn red, like a blush. Night sweats are hot flashes that happen while you are sleeping or trying to sleep.  Bone loss (osteoporosis). The body loses calcium more quickly after menopause, causing the bones to become weaker. This can increase the risk for bone breaks (fractures).  Vaginal dryness. The lining of the vagina can become thin and dry, which can cause pain during sex or cause infection, burning, or itching.  Urinary tract infections.  Urinary incontinence. This is the inability to control when you urinate.  Irritability.  Short-term memory problems. What are the risks? Risks of HRT vary depending on your individual health and medical history. Risks of HRT also depend on whether you receive both estrogen and progestin or you receive estrogen only. HRT may increase the risk of:  Spotting. This is when a small amount of blood leaks from the vagina unexpectedly.  Endometrial cancer. This cancer is in the lining of the uterus (endometrium).  Breast cancer.  Increased density of breast tissue. This can make it harder to find breast cancer on a breast X-ray (mammogram).  Stroke.  Heart disease.  Blood clots.  Gallbladder disease or liver disease. Risks of HRT can increase if you have any of the following conditions:  Endometrial cancer.  Liver disease.  Heart disease.  Breast cancer.    History of blood clots.  History of stroke. Follow these instructions at home: Pap tests  Have Pap tests done as often as told by your health care provider. A Pap test is sometimes called a Pap smear. It is a screening test that is used to check for signs of cancer of the cervix and vagina. A Pap test can also identify the presence of  infection or precancerous changes. Pap tests may be done: ? Every 3 years, starting at age 47. ? Every 5 years, starting after age 54, in combination with testing for human papillomavirus (HPV). ? More often or less often depending on other medical conditions you have, your age, and other risk factors.  It is up to you to get the results of your Pap test. Ask your health care provider, or the department that is doing the test, when your results will be ready. General instructions  Take over-the-counter and prescription medicines only as told by your health care provider.  Do not use any products that contain nicotine or tobacco. These products include cigarettes, chewing tobacco, and vaping devices, such as e-cigarettes. If you need help quitting, ask your health care provider.  Get mammograms, pelvic exams, and medical checkups as often as told by your health care provider.  Keep all follow-up visits. This is important. Contact a health care provider if you have:  Pain or swelling in your legs.  Lumps or changes in your breasts or armpits.  Pain, burning, or bleeding when you urinate.  Unusual vaginal bleeding.  Dizziness or headaches.  Pain in your abdomen. Get help right away if you have:  Shortness of breath.  Chest pain.  Slurred speech.  Weakness or numbness in any part of your arms or legs. These symptoms may represent a serious problem that is an emergency. Do not wait to see if the symptoms will go away. Get medical help right away. Call your local emergency services (911 in the [place] Do not drive yourself to the hospital. Summary  Menopause is a normal time of life when menstrual periods stop completely and the ovaries stop producing the female hormones estrogen and progesterone.  HRT can reduce the frequency and severity of menopausal symptoms.  Risks of HRT vary depending on your individual health and medical history. This information is not intended to  replace advice given to you by your health care provider. Make sure you discuss any questions you have with your health care provider. Document Revised: 05/17/2020 Document Reviewed: 05/17/2020 Elsevier Patient Education  [place].

## 2021-05-10 ENCOUNTER — Other Ambulatory Visit: Payer: Self-pay | Admitting: Family

## 2021-05-10 DIAGNOSIS — I10 Essential (primary) hypertension: Secondary | ICD-10-CM

## 2021-06-01 ENCOUNTER — Other Ambulatory Visit: Payer: Self-pay | Admitting: Family

## 2021-06-17 ENCOUNTER — Encounter: Payer: Managed Care, Other (non HMO) | Admitting: Family

## 2021-06-27 ENCOUNTER — Telehealth: Payer: Self-pay | Admitting: Family

## 2021-06-27 ENCOUNTER — Encounter: Payer: Self-pay | Admitting: Family

## 2021-06-27 ENCOUNTER — Ambulatory Visit (INDEPENDENT_AMBULATORY_CARE_PROVIDER_SITE_OTHER): Payer: Managed Care, Other (non HMO) | Admitting: Family

## 2021-06-27 ENCOUNTER — Other Ambulatory Visit: Payer: Self-pay

## 2021-06-27 VITALS — BP 134/74 | HR 73 | Temp 98.8°F | Resp 16 | Ht 64.0 in | Wt 141.0 lb

## 2021-06-27 DIAGNOSIS — Z0000 Encounter for general adult medical examination without abnormal findings: Secondary | ICD-10-CM | POA: Diagnosis not present

## 2021-06-27 DIAGNOSIS — R195 Other fecal abnormalities: Secondary | ICD-10-CM

## 2021-06-27 NOTE — Assessment & Plan Note (Signed)
Reinforced importance of completing colonoscopy. Pt verbalizes understanding. New GI referral is placed.

## 2021-06-27 NOTE — Telephone Encounter (Signed)
Never mind- I am referring her for colonoscopy as her last cologuard was positive.

## 2021-06-27 NOTE — Assessment & Plan Note (Addendum)
Encouraged pt to continue healthy diet and regular exercise.  Pap/mammo up to date. Refer for colonoscopy (see below).  Immunizations reviewed and up to date.  Labs normal last year.  Ceruminosis is noted.  After verbal consent CMA flushed ears with removal of wax. Pt tolerated procedure well.

## 2021-06-27 NOTE — Telephone Encounter (Signed)
Please initiate follow up cologuard screening.

## 2021-06-27 NOTE — Progress Notes (Addendum)
Subjective:   By signing my name below, I, Tiffany Cline, attest that this documentation has been prepared under the direction and in the presence of Debbrah Alar NP. 06/27/2021    Patient ID: Tiffany Cline, female    DOB: 1961-04-28, 60 y.o.   MRN: [id number]  Chief Complaint  Patient presents with   Annual Exam    HPI Patient is in today for a comprehensive physical exam. She denies having any unexpected weight change, hearing loss and rhinorrhea, visual disturbance, cough, chest pain and leg swelling, nausea, vomiting, diarrhea and blood in stool, or dysuria and frequency, for myalgias, rash, headaches, adenopathy, depression or anxiety at this time. She has no recent surgeries. She has no recent changes in her family medical history. She does not drink, does not smoke tobacco products, does not use drugs. She is not sexually active at this time.   Wrist pain- She complains of occasional wrist pain in her right wrist. 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 She also notes that her pain shoots down her arm when she tightens her grip. She gets swelling through out the day if she does not wear an wrist brace. She takes 7.5-15 mg meloxicam PRN to manage her symptoms.   Immunizations: She is UTD on tetanus vaccines. She is UTD on Covid-19 vaccines. She is willing get the flu vaccine this autumn.  Diet: She maintains a healthy diet.  Exercise: she participates in regular exercise. She notes that she gets less exercise compared to earlier in the year due to the heat outside.  Pap Smear: Last completed Dec 10, 2018.  Mammogram: Last completed 12/21/2020. Results normal. Repeat in 1 year.  Cologaurd- She is due for a cologaurd and is willing to complete it. Her last cologaurd had positive results and she has yet to completed a colonoscopy.  Dental: She is UTD on dental care. Vision: She is due for vision care.  Health Maintenance Due  Topic Date Due   Fecal DNA  (Cologuard)  06/08/2020   INFLUENZA VACCINE  06/27/2021    Past Medical History:  Diagnosis Date   Asthma    induced by cold and allergies   Hypertension    Leiomyoma     Past Surgical History:  Procedure Laterality Date   MYOMECTOMY  2001    Family History  Problem Relation Age of Onset   Diabetes Mother    Hypertension Mother    Hypertension Father    Cancer Father        prostate (15 yrs ago)   Thyroid disease Father        benign tumor   Congestive Heart Failure Father        died from fall/head trauma at age 25   Heart disease Maternal Grandmother        50's   Hypertension Paternal Grandmother    Asthma Paternal Grandfather    Congestive Heart Failure Half-Brother    CAD Maternal Aunt        50's   CAD Maternal Aunt        50's    Social History   Socioeconomic History   Marital status: Single    Spouse name: n/a   Number of children: 0   Years of education: RPh   Highest education level: Not on file  Occupational History   Occupation: Pharmacist  Tobacco Use   Smoking status: Never   Smokeless tobacco: Never  Vaping Use   Vaping Use:  Never used  Substance and Sexual Activity   Alcohol use: No   Drug use: No   Sexual activity: Not Currently    Birth control/protection: Pill  Other Topics Concern   Not on file  Social History Narrative   Lives alone with her pets. (2 dogs)   Family lives nearby.   Works part time as a Software engineer at [employer]   Enjoys travelling   Investment banker, operational of Sales executive: Not on [employer] Insecurity: Not on file  Transportation Needs: Not on file  Physical Activity: Not on file  Stress: Not on file  Social Connections: Not on file  Intimate Partner Violence: Not on file    Outpatient Medications Prior to Visit  Medication Sig Dispense Refill   amLODipine (NORVASC) 5 MG tablet Take 1 tablet by mouth once daily 90 tablet 0   Azelastine HCl 137 MCG/SPRAY SOLN Place 2 sprays into the nose 2  (two) times daily as needed. 30 mL 12   cyclobenzaprine (FLEXERIL) 10 MG tablet TAKE 1 TABLET BY MOUTH THREE TIMES DAILY AS NEEDED FOR MUSCLE SPASMS 30 tablet 0   estradiol (ESTRACE) 0.5 MG tablet Take 1 tablet (0.5 mg total) by mouth daily. 30 tablet 11   fluticasone (FLONASE) 50 MCG/ACT nasal spray USE TWO SPRAY(S) IN EACH NOSTRIL ONCE DAILY 16 g 11   meloxicam (MOBIC) 15 MG tablet Take 0.5-1 tablets (7.5-15 mg total) by mouth daily as needed for pain. 30 tablet 6   montelukast (SINGULAIR) 10 MG tablet Take 1 tablet (10 mg total) by mouth at bedtime. 90 tablet 2   PROAIR HFA 108 (90 Base) MCG/ACT inhaler INHALE 2 PUFFS BY MOUTH EVERY 4 HOURS AS NEEDED FOR WHEEZING, COUGH OR SHORTNESS OF BREATH 18 g 2   progesterone (PROMETRIUM) 100 MG capsule Take 1 capsule (100 mg total) by mouth daily. 30 capsule 11   promethazine (PHENERGAN) 25 MG tablet Take 1 tablet (25 mg total) by mouth every 8 (eight) hours as needed for nausea or vomiting. 20 tablet 0   SUMAtriptan (IMITREX) 50 MG tablet May repeat in 2 hours if headache persists or recurs. 10 tablet 5   OVER THE COUNTER MEDICATION OTC Estrogen Soy     Facility-Administered Medications Prior to Visit  Medication Dose Route Frequency Provider Last Rate Last Admin   ipratropium (ATROVENT) nebulizer solution 0.5 mg  0.5 mg Nebulization Once Dunn, Ryan M, PA-C        Allergies  Allergen Reactions   Septra [Sulfamethoxazole-Trimethoprim] Shortness Of Breath and Rash   Latex Itching and Rash    Itching and redness with rash with use of latex gloves when she had blood drawn   Sulfa Antibiotics Rash    Review of Systems  Constitutional:        (-)unexpected weight change (-)Adenopathy  HENT:  Negative for hearing loss.        (-)Rhinorrhea   Eyes:        (-)Visual disturbance  Respiratory:  Negative for cough.   Cardiovascular:  Negative for chest pain and leg swelling.  Gastrointestinal:  Negative for blood in stool, constipation, diarrhea,  nausea and vomiting.  Genitourinary:  Negative for dysuria and frequency.  Musculoskeletal:  Positive for joint pain (Occasional wrist pain). Negative for myalgias.  Skin:  Negative for rash.  Neurological:  Negative for headaches.  Psychiatric/Behavioral:  Negative for depression. The patient is not nervous/anxious.       Objective:    Physical Exam  Constitutional:      General: She is not in acute distress.    Appearance: Normal appearance. She is not ill-appearing.  HENT:     Head: Normocephalic and atraumatic.     Right Ear: Tympanic membrane, ear canal and external ear normal. There is impacted cerumen.     Left Ear: Tympanic membrane, ear canal and external ear normal. There is impacted cerumen.  Eyes:     Extraocular Movements: Extraocular movements intact.     Pupils: Pupils are equal, round, and reactive to light.     Comments: No nystagmus   Cardiovascular:     Rate and Rhythm: Regular rhythm.     Pulses: Normal pulses.     Heart sounds: Normal heart sounds. No murmur heard.   No gallop.  Pulmonary:     Effort: Pulmonary effort is normal. No respiratory distress.     Breath sounds: Normal breath sounds. No wheezing or rales.  Chest:  Breasts:    Right: Normal.     Left: Normal.     Comments: Inverted left nipple Abdominal:     General: Bowel sounds are normal. There is no distension.     Palpations: Abdomen is soft. There is no mass.     Tenderness: There is no abdominal tenderness. There is no guarding or rebound.  Musculoskeletal:     Comments: 5/5 strength in both upper and lower extremities  Lymphadenopathy:     Cervical: No cervical adenopathy.  Skin:    General: Skin is warm and dry.  Neurological:     Mental Status: She is alert and oriented to person, place, and time.     Deep Tendon Reflexes:     Reflex Scores:      Patellar reflexes are 2+ on the right side and 2+ on the left side. Psychiatric:        Behavior: Behavior normal.    BP 134/74  (BP Location: Right Arm, Patient Position: Sitting, Cuff Size: Small)   Pulse 73   Temp 98.8 F (37.1 C) (Oral)   Resp 16   Ht '5\' 4"'$  (1.626 m)   Wt 141 lb (64 kg)   LMP 07/22/2020 (LMP Unknown)   SpO2 100%   BMI 24.20 kg/m  Wt Readings from Last 3 Encounters:  06/27/21 141 lb (64 kg)  03/18/21 130 lb 12.8 oz (59.3 kg)  12/22/20 128 lb (58.1 kg)       Assessment & Plan:   Problem List Items Addressed This Visit       Unprioritized   Preventative health care - Primary    Encouraged pt to continue healthy diet and regular exercise.  Pap/mammo up to date. Refer for colonoscopy (see below).  Immunizations reviewed and up to date.  Labs normal last year.  Ceruminosis is noted.  After verbal consent CMA flushed ears with removal of wax. Pt tolerated procedure well.       Positive colorectal cancer screening using Cologuard test    Reinforced importance of completing colonoscopy. Pt verbalizes understanding. New GI referral is placed.        Relevant Orders   Ambulatory referral to Gastroenterology     No orders of the defined types were placed in this encounter.   I, Debbrah Alar NP, personally preformed the services described in this documentation.  All medical record entries made by the scribe were at my direction and in my presence.  I have reviewed the chart and discharge instructions (if applicable) and agree that  the record reflects my personal performance and is accurate and complete. 06/27/2021   I,Tiffany Cline,acting as a Education administrator for Nance Pear, NP.,have documented all relevant documentation on the behalf of Nance Pear, NP,as directed by  Nance Pear, NP while in the presence of Nance Pear, NP.   Nance Pear, NP

## 2021-06-27 NOTE — Patient Instructions (Signed)
Preventive Care 68-58 Years Old, Female Preventive care refers to lifestyle choices and visits with your health care provider that can promote health and wellness. This includes: A yearly physical exam. This is also called an annual wellness visit. Regular dental and eye exams. Immunizations. Screening for certain conditions. Healthy lifestyle choices, such as: Eating a healthy diet. Getting regular exercise. Not using drugs or products that contain nicotine and tobacco. Limiting alcohol use. What can I expect for my preventive care visit? Physical exam Your health care provider will check your: Height and weight. These may be used to calculate your BMI (body mass index). BMI is a measurement that tells if you are at a healthy weight. Heart rate and blood pressure. Body temperature. Skin for abnormal spots. Counseling Your health care provider may ask you questions about your: Past medical problems. Family's medical history. Alcohol, tobacco, and drug use. Emotional well-being. Home life and relationship well-being. Sexual activity. Diet, exercise, and sleep habits. Work and work Statistician. Access to firearms. Method of birth control. Menstrual cycle. Pregnancy history. What immunizations do I need?  Vaccines are usually given at various ages, according to a schedule. Your health care provider will recommend vaccines for you based on your age, medicalhistory, and lifestyle or other factors, such as travel or where you work. What tests do I need? Blood tests Lipid and cholesterol levels. These may be checked every 5 years, or more often if you are over 37 years old. Hepatitis C test. Hepatitis B test. Screening Lung cancer screening. You may have this screening every year starting at age 30 if you have a 30-pack-year history of smoking and currently smoke or have quit within the past 15 years. Colorectal cancer screening. All adults should have this screening starting at  age 23 and continuing until age 3. Your health care provider may recommend screening at age 88 if you are at increased risk. You will have tests every 1-10 years, depending on your results and the type of screening test. Diabetes screening. This is done by checking your blood sugar (glucose) after you have not eaten for a while (fasting). You may have this done every 1-3 years. Mammogram. This may be done every 1-2 years. Talk with your health care provider about when you should start having regular mammograms. This may depend on whether you have a family history of breast cancer. BRCA-related cancer screening. This may be done if you have a family history of breast, ovarian, tubal, or peritoneal cancers. Pelvic exam and Pap test. This may be done every 3 years starting at age 79. Starting at age 54, this may be done every 5 years if you have a Pap test in combination with an HPV test. Other tests STD (sexually transmitted disease) testing, if you are at risk. Bone density scan. This is done to screen for osteoporosis. You may have this scan if you are at high risk for osteoporosis. Talk with your health care provider about your test results, treatment options,and if necessary, the need for more tests. Follow these instructions at home: Eating and drinking  Eat a diet that includes fresh fruits and vegetables, whole grains, lean protein, and low-fat dairy products. Take vitamin and mineral supplements as recommended by your health care provider. Do not drink alcohol if: Your health care provider tells you not to drink. You are pregnant, may be pregnant, or are planning to become pregnant. If you drink alcohol: Limit how much you have to 0-1 drink a day. Be aware  of how much alcohol is in your drink. In the [place] one drink equals one 12 oz bottle of beer (355 mL), one 5 oz glass of wine (148 mL), or one 1 oz glass of hard liquor (44 mL).  Lifestyle Take daily care of your teeth and  gums. Brush your teeth every morning and night with fluoride toothpaste. Floss one time each day. Stay active. Exercise for at least 30 minutes 5 or more days each week. Do not use any products that contain nicotine or tobacco, such as cigarettes, e-cigarettes, and chewing tobacco. If you need help quitting, ask your health care provider. Do not use drugs. If you are sexually active, practice safe sex. Use a condom or other form of protection to prevent STIs (sexually transmitted infections). If you do not wish to become pregnant, use a form of birth control. If you plan to become pregnant, see your health care provider for a prepregnancy visit. If told by your health care provider, take low-dose aspirin daily starting at age 29. Find healthy ways to cope with stress, such as: Meditation, yoga, or listening to music. Journaling. Talking to a trusted person. Spending time with friends and family. Safety Always wear your seat belt while driving or riding in a vehicle. Do not drive: If you have been drinking alcohol. Do not ride with someone who has been drinking. When you are tired or distracted. While texting. Wear a helmet and other protective equipment during sports activities. If you have firearms in your house, make sure you follow all gun safety procedures. What's next? Visit your health care provider once a year for an annual wellness visit. Ask your health care provider how often you should have your eyes and teeth checked. Stay up to date on all vaccines. This information is not intended to replace advice given to you by your health care provider. Make sure you discuss any questions you have with your healthcare provider. Document Revised: 08/17/2020 Document Reviewed: 07/25/2018 Elsevier Patient Education  2022 [employer].

## 2021-07-06 ENCOUNTER — Other Ambulatory Visit: Payer: Self-pay

## 2021-07-06 ENCOUNTER — Encounter: Payer: Self-pay | Admitting: Family Medicine

## 2021-07-06 ENCOUNTER — Ambulatory Visit (INDEPENDENT_AMBULATORY_CARE_PROVIDER_SITE_OTHER): Payer: Managed Care, Other (non HMO) | Admitting: Family Medicine

## 2021-07-06 DIAGNOSIS — M654 Radial styloid tenosynovitis [de Quervain]: Secondary | ICD-10-CM

## 2021-07-06 DIAGNOSIS — M79632 Pain in left forearm: Secondary | ICD-10-CM

## 2021-07-06 NOTE — Progress Notes (Signed)
Office Visit Note   Patient: Tiffany Cline           Date of Birth: [birth year]           MRN: [id number] Visit Date: 07/06/2021 Requested by: Debbrah Alar, NP [address] [place],  [place] [address] PCP: Debbrah Alar, NP  Subjective: Chief Complaint  Patient presents with   Other    Pain in bilateral forearms. Pain starts at the base of the thumb and radiates up the forearm. Has a removable thumb spica splint - helps. Right-hand dominant. The pain on the left forearm starts in the lateral elbow. Wearing a TE band - helps some. Does have pain in the base of the left thumb, but not as severe as the right.    HPI: She is here with bilateral arm pain.  Since last visit, she has been placed on hormone replacement and is feeling better overall.  However, her right wrist hurts on the radial aspect into the thumb and then proximally toward the elbow.  Her left forearm hurts just distal to the elbow mostly on the lateral aspect.  She notices pain especially when lifting up her dog.               ROS:   All other systems were reviewed and are negative.  Objective: Vital Signs: LMP 07/22/2020 (LMP Unknown)   Physical Exam:  General:  Alert and oriented, in no acute distress. Pulm:  Breathing unlabored. Psy:  Normal mood, congruent affect.  Right arm: She has tenderness at the first dorsal compartment with a positive Finkelstein test.  Pain with thumb extension against resistance. Left arm: She is tender to deeper palpation on the anterolateral and proximal forearm muscles.  She has pain with supination against resistance.  No tenderness over the epicondyles or in the radial tunnel.    Imaging: No results found.  Assessment & Plan: Right wrist de Quervain's tenosynovitis and left forearm supinator strain/myofascial pain -Discussed options and elected to refer her to physical therapy and hand for rehab.  If no improvement, could contemplate  injection.     Procedures: No procedures performed        PMFS History: Patient Active Problem List   Diagnosis Date Noted   Preventative health care 06/27/2021   Migraines 03/18/2021   Positive colorectal cancer screening using Cologuard test 03/13/2018   Mild intermittent asthma without complication [id number]   Abnormal CXR 04/12/2016   Asthma, chronic 06/30/2014   Essential hypertension 05/27/2013   Allergic rhinitis 05/27/2013   Past Medical History:  Diagnosis Date   Asthma    induced by cold and allergies   Hypertension    Leiomyoma     Family History  Problem Relation Age of Onset   Diabetes Mother    Hypertension Mother    Hypertension Father    Cancer Father        prostate (15 yrs ago)   Thyroid disease Father        benign tumor   Congestive Heart Failure Father        died from fall/head trauma at age 82   Heart disease Maternal Grandmother        50's   Hypertension Paternal Grandmother    Asthma Paternal Grandfather    Congestive Heart Failure Half-Brother    CAD Maternal Aunt        50's   CAD Maternal Aunt        50's  Past Surgical History:  Procedure Laterality Date   MYOMECTOMY  2001   Social History   Occupational History   Occupation: Software engineer  Tobacco Use   Smoking status: Never   Smokeless tobacco: Never  Vaping Use   Vaping Use: Never used  Substance and Sexual Activity   Alcohol use: No   Drug use: No   Sexual activity: Not Currently    Birth control/protection: Pill

## 2021-07-13 ENCOUNTER — Telehealth: Payer: Self-pay

## 2021-07-13 NOTE — Telephone Encounter (Signed)
Done

## 2021-07-13 NOTE — Telephone Encounter (Signed)
[facility] is requesting orders for patient's outpatient therapy to be faxed. Patient is currently being treated for Right wrist de Quervain's tenosynovitis and left forearm supinator strain/myofascial pain.  Fax# [phone]

## 2021-07-15 ENCOUNTER — Other Ambulatory Visit: Payer: Self-pay | Admitting: Family

## 2021-08-12 ENCOUNTER — Other Ambulatory Visit: Payer: Self-pay | Admitting: Nurse Practitioner

## 2021-08-12 DIAGNOSIS — Z79890 Hormone replacement therapy (postmenopausal): Secondary | ICD-10-CM

## 2021-08-12 DIAGNOSIS — N951 Menopausal and female climacteric states: Secondary | ICD-10-CM

## 2021-08-15 NOTE — Telephone Encounter (Signed)
Pharmacy requesting a 90 day supply

## 2021-08-22 ENCOUNTER — Ambulatory Visit (INDEPENDENT_AMBULATORY_CARE_PROVIDER_SITE_OTHER): Payer: Managed Care, Other (non HMO) | Admitting: Pulmonary Disease

## 2021-08-22 ENCOUNTER — Encounter: Payer: Self-pay | Admitting: Pulmonary Disease

## 2021-08-22 ENCOUNTER — Other Ambulatory Visit: Payer: Self-pay

## 2021-08-22 VITALS — BP 136/72 | HR 70 | Temp 98.4°F | Ht 64.0 in | Wt 145.6 lb

## 2021-08-22 DIAGNOSIS — J4541 Moderate persistent asthma with (acute) exacerbation: Secondary | ICD-10-CM

## 2021-08-22 NOTE — Progress Notes (Signed)
Tiffany Cline    [id number]    Aug 27, 1961  Primary Care Physician:O'Sullivan, Lenna Sciara, NP  Referring Physician: Debbrah Alar, NP [address] [place],  [place] [address]  Chief complaint:  Follow up for Moderate persistent asthma  HPI: Tiffany Cline is a 60 year old with past medical history of asthma, hypertension. She was diagnosed around 2011 and has been maintained on Advair and more recently [facility]. She has good control for most year but does have worsening of symptoms in wintertime. She has seasonal allergies, occasional postnasal drip. She denies any heartburn symptoms. She is allergic to cats. She has a dog at home but is not sensitive to it. 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  2018-Dulera reduced to 100 but had to go back up due to increased dyspnea, wheezing.   Interim History: On Dulera 200 but it has been on back Order since last year so she has been using Advair. She continues to do well on Advair. Using her rescue inhaler some mornings but no significant exacerbations while on Advair  Plan to get her flu shot and 3rd covid booster next month.   ACQ-6 08/11/19- 0.5             Outpatient Encounter Medications as of 08/22/2021  Medication Sig   amLODipine (NORVASC) 5 MG tablet Take 1 tablet by mouth once daily   Astaxanthin 4 MG CAPS    Azelastine HCl 137 MCG/SPRAY SOLN Place 2 sprays into the nose 2 (two) times daily as needed.   Coenzyme Q10 10 MG capsule    Collagen-Vitamin C-Biotin (COLLAGEN 1500/C PO)    cyclobenzaprine (FLEXERIL) 10 MG tablet TAKE 1 TABLET BY MOUTH THREE TIMES DAILY AS NEEDED FOR MUSCLE SPASMS   DULERA 200-5 MCG/ACT AERO    estradiol (ESTRACE) 0.5 MG tablet Take 1 tablet (0.5 mg total) by mouth daily.   fluticasone (FLONASE) 50 MCG/ACT nasal spray USE TWO SPRAY(S) IN EACH NOSTRIL ONCE DAILY   Glucosamine-Chondroit-Vit C-Mn (GLUCOSAMINE CHONDROITIN COMPLX) CAPS     meloxicam (MOBIC) 15 MG tablet Take 0.5-1 tablets (7.5-15 mg total) by mouth daily as needed for pain.   montelukast (SINGULAIR) 10 MG tablet Take 1 tablet (10 mg total) by mouth at bedtime.   PROAIR HFA 108 (90 Base) MCG/ACT inhaler INHALE 2 PUFFS BY MOUTH EVERY 4 HOURS AS NEEDED FOR WHEEZING, COUGH OR SHORTNESS OF BREATH   progesterone (PROMETRIUM) 100 MG capsule TAKE 1 CAPSULE BY MOUTH EVERY DAY   promethazine (PHENERGAN) 25 MG tablet Take 1 tablet (25 mg total) by mouth every 8 (eight) hours as needed for nausea or vomiting.   Red Yeast Rice 600 MG CAPS    Resveratrol 100 MG CAPS    SUMAtriptan (IMITREX) 50 MG tablet MAY REPEATIN 2 HOURS IF HEADACHE PERSISTS OR RECURS   fluticasone-salmeterol (ADVAIR) 250-50 MCG/ACT AEPB 2 (two) times daily. (Patient not taking: Reported on 08/22/2021)   Facility-Administered Encounter Medications as of 08/22/2021  Medication   ipratropium (ATROVENT) nebulizer solution 0.5 mg    Physical Exam: Blood pressure 120/80, pulse 82, temperature (!) 96.4 F (35.8 C), temperature source Temporal, height 5\' 4"  (1.626 m), weight 140 lb 6.4 oz (63.7 kg), SpO2 98 %. Gen:      No acute distress HEENT:  EOMI, sclera anicteric Neck:     No masses; no thyromegaly Lungs:    Clear to auscultation bilaterally; normal respiratory  effort. No wheezing or rales.  CV:         Regular rate and rhythm; no murmurs Abd:      + bowel sounds; soft, non-tender; no palpable masses, no distension Ext:    No edema; adequate peripheral perfusion Skin:      Warm and dry; no rash Neuro: alert and oriented x 3 Psych: normal mood and affect  Data Reviewed: Imaging Chest x-ray 04/12/16- Mild rt apical scarring. Chest x-ray 08/09/18- mild hyperinflation with stable linear scarring. I have reviewed the images personally.  PFTs  06/02/15 FVC 3.02 [110%), FEV1 2.51 (115%), F/F 83 Normal spirometry  FENO 06/15/16-10  Labs CBC with diff 06/15/16. WBC 5.9, absolute eosinophil count-  112 Blood allergy profile- 06/15/16- Negative, IgE 42  Act score 08/10/2020-24 ACT score 08/22/2021 -- 21  Assessment:  Moderate persistent asthma Has not been on Dulera since November 2021 due to back order. Using Advair as a replacement. Symptoms remains stable with occasional use of rescue inhaler. She'll continue on albuterol inhaler as needed.   Sinusitis, Allergic rhinitis Treating with Singulair, Flonase as needed   Abnormal CXR Chest x-ray shows mild scarring is stable. We'll continue to monitor. Further evaluation with a CT scan was discussed at previous visit but she declined.  Plan/Recommendations: - Continue Advair until she can get dulera - Continue Singulair, Flonase - Flu vaccination at work place  Follow-up in 1 year                                                                                                   Marshell Garfinkel MD [facility] 08/22/2021, 9:47 AM  CC: Debbrah Alar, NP

## 2021-08-22 NOTE — Patient Instructions (Signed)
Tiffany Cline, it was a pleasure to see you today. We are glad your asthma symptoms have improved. Continue using the Advair until you have access to the [facility].   We will see you back in clinic in 1 year

## 2021-08-23 ENCOUNTER — Encounter: Payer: Self-pay | Admitting: Pulmonary Disease

## 2021-08-23 MED ORDER — PROAIR HFA 108 (90 BASE) MCG/ACT IN AERS: INHALATION_SPRAY | RESPIRATORY_TRACT | 3 refills | Status: DC

## 2021-08-23 MED ORDER — MOMETASONE FURO-FORMOTEROL FUM 200-5 MCG/ACT IN AERO: 2.0000 | INHALATION_SPRAY | Freq: Two times a day (BID) | RESPIRATORY_TRACT | 6 refills | Status: DC

## 2021-08-23 MED ORDER — MONTELUKAST SODIUM 10 MG PO TABS: 10.0000 mg | ORAL_TABLET | Freq: Every day | ORAL | 2 refills | Status: DC

## 2021-09-02 ENCOUNTER — Telehealth: Payer: Self-pay | Admitting: Pulmonary Disease

## 2021-09-02 ENCOUNTER — Other Ambulatory Visit: Payer: Self-pay | Admitting: Pulmonary Disease

## 2021-09-02 DIAGNOSIS — J4541 Moderate persistent asthma with (acute) exacerbation: Secondary | ICD-10-CM

## 2021-09-02 MED ORDER — PROAIR HFA 108 (90 BASE) MCG/ACT IN AERS: INHALATION_SPRAY | RESPIRATORY_TRACT | 3 refills | Status: DC

## 2021-09-02 NOTE — Telephone Encounter (Signed)
Refill has been sent to the pharmacy.  I have called and LM on VM to make the pt aware.

## 2021-09-06 MED ORDER — ALBUTEROL SULFATE HFA 108 (90 BASE) MCG/ACT IN AERS: 2.0000 | INHALATION_SPRAY | Freq: Four times a day (QID) | RESPIRATORY_TRACT | 2 refills | Status: AC | PRN

## 2021-09-07 ENCOUNTER — Other Ambulatory Visit: Payer: Self-pay

## 2021-09-07 ENCOUNTER — Other Ambulatory Visit: Payer: Self-pay | Admitting: Family

## 2021-09-07 ENCOUNTER — Ambulatory Visit (INDEPENDENT_AMBULATORY_CARE_PROVIDER_SITE_OTHER): Payer: Managed Care, Other (non HMO) | Admitting: Medical

## 2021-09-07 VITALS — BP 139/64 | HR 79 | Temp 98.0°F | Resp 18 | Ht 64.0 in | Wt 144.0 lb

## 2021-09-07 DIAGNOSIS — H1089 Other conjunctivitis: Secondary | ICD-10-CM

## 2021-09-07 DIAGNOSIS — J3089 Other allergic rhinitis: Secondary | ICD-10-CM | POA: Diagnosis not present

## 2021-09-07 MED ORDER — TOBRAMYCIN 0.3 % OP SOLN: 2.0000 [drp] | Freq: Four times a day (QID) | OPHTHALMIC | 0 refills | Status: DC

## 2021-09-07 MED ORDER — OLOPATADINE HCL 0.1 % OP SOLN: 1.0000 [drp] | Freq: Two times a day (BID) | OPHTHALMIC | 12 refills | Status: DC

## 2021-09-07 NOTE — Patient Instructions (Addendum)
Left eye conjunctivitis (likely early)with hx of allergic rhinitis. Continue current allergy meds. Presently add Patanol eye drops to regimen. If your get matting to left eye/dc then switch to tobrex eye solution.  If your symptoms change, persist or worsen let us know. Any vision changes or eye pressure/pain please update [place] quickly.  Follow up as needed.

## 2021-09-07 NOTE — Progress Notes (Signed)
Subjective:    Patient ID: Tiffany Cline, female    DOB: September 11, 1961, 60 y.o.   MRN: [id number]  HPI Pt in with left eye mild red this morning. No itching, no pain and no blurred vision. No matting to her eye. No light sensitivity to her eye.  Pt has has fall allergies and both eye watering for 2-3 weeks. Some nasal congested year round.    Pt is on singulair. Pt states can't take antihistamine. In past those contributed to migraine.  Pt asthma is well controlled.     Review of Systems  Constitutional:  Negative for chills, fatigue and fever.  HENT:  Negative for congestion, drooling and ear discharge.   Respiratory:  Negative for cough, chest tightness, shortness of breath and wheezing.   Cardiovascular:  Negative for chest pain and palpitations.  Gastrointestinal:  Negative for abdominal pain.  Musculoskeletal:  Negative for back pain.  Skin:  Negative for rash.    Past Medical History:  Diagnosis Date   Asthma    induced by cold and allergies   Hypertension    Leiomyoma      Social History   Socioeconomic History   Marital status: Single    Spouse name: n/a   Number of children: 0   Years of education: RPh   Highest education level: Not on file  Occupational History   Occupation: Pharmacist  Tobacco Use   Smoking status: Never   Smokeless tobacco: Never  Vaping Use   Vaping Use: Never used  Substance and Sexual Activity   Alcohol use: No   Drug use: No   Sexual activity: Not Currently    Birth control/protection: Pill  Other Topics Concern   Not on file  Social History Narrative   Lives alone with her pets. (2 dogs)   Family lives nearby.   Works part time as a Software engineer at [employer]   Enjoys travelling   Investment banker, operational of Sales executive: Not on [employer] Insecurity: Not on file  Transportation Needs: Not on file  Physical Activity: Not on file  Stress: Not on file  Social Connections: Not on file  Intimate Partner  Violence: Not on file    Past Surgical History:  Procedure Laterality Date   MYOMECTOMY  2001    Family History  Problem Relation Age of Onset   Diabetes Mother    Hypertension Mother    Hypertension Father    Cancer Father        prostate (15 yrs ago)   Thyroid disease Father        benign tumor   Congestive Heart Failure Father        died from fall/head trauma at age 19   Heart disease Maternal Grandmother        50's   Hypertension Paternal Grandmother    Asthma Paternal Grandfather    Congestive Heart Failure Half-Brother    CAD Maternal Aunt        50's   CAD Maternal Aunt        50's    Allergies  Allergen Reactions   Septra [Sulfamethoxazole-Trimethoprim] Shortness Of Breath and Rash   Latex Itching and Rash    Itching and redness with rash with use of latex gloves when she had blood drawn   Sulfa Antibiotics Rash    Current Outpatient Medications on File Prior to Visit  Medication Sig Dispense Refill   albuterol (VENTOLIN HFA) 108 (90 Base) MCG/ACT inhaler  Inhale 2 puffs into the lungs every 6 (six) hours as needed for wheezing or shortness of breath. 8 g 2   amLODipine (NORVASC) 5 MG tablet Take 1 tablet by mouth once daily 90 tablet 0   Astaxanthin 4 MG CAPS      Azelastine HCl 137 MCG/SPRAY SOLN Place 2 sprays into the nose 2 (two) times daily as needed. 30 mL 12   Coenzyme Q10 10 MG capsule      Collagen-Vitamin C-Biotin (COLLAGEN 1500/C PO)      cyclobenzaprine (FLEXERIL) 10 MG tablet TAKE 1 TABLET BY MOUTH THREE TIMES DAILY AS NEEDED FOR MUSCLE SPASMS 30 tablet 0   DULERA 200-5 MCG/ACT AERO      estradiol (ESTRACE) 0.5 MG tablet Take 1 tablet (0.5 mg total) by mouth daily. 30 tablet 11   fluticasone (FLONASE) 50 MCG/ACT nasal spray USE TWO SPRAY(S) IN EACH NOSTRIL ONCE DAILY 16 g 11   Glucosamine-Chondroit-Vit C-Mn (GLUCOSAMINE CHONDROITIN COMPLX) CAPS      meloxicam (MOBIC) 15 MG tablet Take 0.5-1 tablets (7.5-15 mg total) by mouth daily as needed  for pain. 30 tablet 6   mometasone-formoterol (DULERA) 200-5 MCG/ACT AERO Inhale 2 puffs into the lungs 2 (two) times daily. 1 each 6   montelukast (SINGULAIR) 10 MG tablet Take 1 tablet (10 mg total) by mouth at bedtime. 90 tablet 2   progesterone (PROMETRIUM) 100 MG capsule TAKE 1 CAPSULE BY MOUTH EVERY DAY 90 capsule 1   promethazine (PHENERGAN) 25 MG tablet Take 1 tablet (25 mg total) by mouth every 8 (eight) hours as needed for nausea or vomiting. 20 tablet 0   Red Yeast Rice 600 MG CAPS      Resveratrol 100 MG CAPS      SUMAtriptan (IMITREX) 50 MG tablet MAY REPEATIN 2 HOURS IF HEADACHE PERSISTS OR RECURS 9 tablet 3   Current Facility-Administered Medications on File Prior to Visit  Medication Dose Route Frequency Provider Last Rate Last Admin   ipratropium (ATROVENT) nebulizer solution 0.5 mg  0.5 mg Nebulization Once Dunn, Ryan M, PA-C        BP 139/64   Pulse 79   Temp 98 F (36.7 C)   Resp 18   Ht 5\' 4"  (1.626 m)   Wt 144 lb (65.3 kg)   LMP 07/22/2020 (LMP Unknown)   SpO2 100%   BMI 24.72 kg/m       Objective:   Physical Exam   General- No acute distress. Pleasant patient. Neck- Full range of motion, no jvd Lungs- Clear, even and unlabored. Heart- regular rate and rhythm. Neurologic- CNII- XII grossly intact.  Eyes- perrl, rt eye mild conjunctviitis. Fluroscein stain negative.     Assessment & Plan:   Patient Instructions  Left eye conjunctivitis with hx of allergic rhinitis. Continue current allergy meds. Presently add Patanol eye drops to regimen. If your get matting to left eye/dc then switch to tobrex eye solution.  If your symptoms change, persist or worsen let us know. Any vision changes or eye pressure/pain please update [place] quickly.  Follow up as needed.    Mackie Pai, PA-C

## 2021-09-12 ENCOUNTER — Encounter: Payer: Self-pay | Admitting: Family

## 2021-09-12 DIAGNOSIS — M25569 Pain in unspecified knee: Secondary | ICD-10-CM

## 2021-09-28 ENCOUNTER — Other Ambulatory Visit: Payer: Self-pay | Admitting: Family

## 2021-09-30 ENCOUNTER — Other Ambulatory Visit: Payer: Self-pay | Admitting: Family

## 2021-10-06 ENCOUNTER — Other Ambulatory Visit: Payer: Self-pay | Admitting: Nurse Practitioner

## 2021-10-06 DIAGNOSIS — N951 Menopausal and female climacteric states: Secondary | ICD-10-CM

## 2021-10-06 DIAGNOSIS — Z79890 Hormone replacement therapy (postmenopausal): Secondary | ICD-10-CM

## 2021-10-07 NOTE — Telephone Encounter (Signed)
Annual exam was on 1/22  Pharmacy requesting 90 day supply.

## 2021-10-24 ENCOUNTER — Encounter: Payer: Self-pay | Admitting: Family

## 2021-10-25 MED ORDER — MELOXICAM 15 MG PO TABS: 7.5000 mg | ORAL_TABLET | Freq: Every day | ORAL | 6 refills | Status: DC | PRN

## 2021-11-09 ENCOUNTER — Other Ambulatory Visit: Payer: Self-pay

## 2021-11-09 NOTE — Telephone Encounter (Signed)
Received paper refill request from [facility] Re: per pharmacy notes "Pt requesting Rx for estradiol-please send if ok." It was noticed that she had been Rx'd a 90 day supply on 10/07/21 to her previous pharmacy at [place]. I called pt to verify and pt reported she did not need Rx refill for estradiol, she requested refill for progesterone. Pt notified 90 day Rx w/ 1 refill was sent 08/15/21 [facility] so she should have enough medication available until her next AEX is due in 11/2021. Pt voiced understanding, will get in contact with pharmacy and if needs further assistance, will contact us back.

## 2021-11-21 ENCOUNTER — Other Ambulatory Visit: Payer: Self-pay | Admitting: Family

## 2021-12-13 ENCOUNTER — Other Ambulatory Visit: Payer: Self-pay | Admitting: Family

## 2021-12-13 DIAGNOSIS — I10 Essential (primary) hypertension: Secondary | ICD-10-CM

## 2021-12-30 ENCOUNTER — Ambulatory Visit: Payer: Managed Care, Other (non HMO) | Admitting: Family

## 2021-12-30 ENCOUNTER — Other Ambulatory Visit: Payer: Self-pay

## 2021-12-30 ENCOUNTER — Ambulatory Visit (INDEPENDENT_AMBULATORY_CARE_PROVIDER_SITE_OTHER): Payer: Managed Care, Other (non HMO) | Admitting: Family

## 2021-12-30 ENCOUNTER — Telehealth: Payer: Self-pay | Admitting: Family

## 2021-12-30 VITALS — BP 140/71 | HR 56 | Temp 98.1°F | Resp 16 | Wt 138.6 lb

## 2021-12-30 DIAGNOSIS — G43909 Migraine, unspecified, not intractable, without status migrainosus: Secondary | ICD-10-CM

## 2021-12-30 DIAGNOSIS — M1990 Unspecified osteoarthritis, unspecified site: Secondary | ICD-10-CM | POA: Diagnosis not present

## 2021-12-30 DIAGNOSIS — R739 Hyperglycemia, unspecified: Secondary | ICD-10-CM

## 2021-12-30 DIAGNOSIS — E875 Hyperkalemia: Secondary | ICD-10-CM

## 2021-12-30 DIAGNOSIS — R195 Other fecal abnormalities: Secondary | ICD-10-CM | POA: Diagnosis not present

## 2021-12-30 DIAGNOSIS — I10 Essential (primary) hypertension: Secondary | ICD-10-CM

## 2021-12-30 LAB — COMPREHENSIVE METABOLIC PANEL
ALT: 17 U/L (ref 0–35)
AST: 16 U/L (ref 0–37)
Albumin: 4 g/dL (ref 3.5–5.2)
Alkaline Phosphatase: 76 U/L (ref 39–117)
BUN: 12 mg/dL (ref 6–23)
CO2: 36 mEq/L — ABNORMAL HIGH (ref 19–32)
Calcium: 8.9 mg/dL (ref 8.4–10.5)
Chloride: 100 mEq/L (ref 96–112)
Creatinine, Ser: 0.8 mg/dL (ref 0.40–1.20)
GFR: 81.05 mL/min (ref >60.00)
Glucose, Bld: 115 mg/dL — ABNORMAL HIGH (ref 70–99)
Potassium: 3.3 mEq/L — ABNORMAL LOW (ref 3.5–5.1)
Sodium: 139 mEq/L (ref 135–145)
Total Bilirubin: 0.5 mg/dL (ref 0.2–1.2)
Total Protein: 6.8 g/dL (ref 6.0–8.3)

## 2021-12-30 MED ORDER — SUMATRIPTAN SUCCINATE 50 MG PO TABS: ORAL_TABLET | ORAL | 3 refills | Status: DC

## 2021-12-30 NOTE — Assessment & Plan Note (Signed)
Recommended that she try to cut the meloxicam down to 1/2 tab (7.5mg ) once daily with 1000mg  of tylenol to see if this holds her.  Discussed risks of GI bleed and kidney issues with prolonged use.

## 2021-12-30 NOTE — Telephone Encounter (Signed)
Talked to patient and advised of results  and provider's advise. Scheduled to come back in 2 weeks for labs, orders entered as future.  Potassium rich food diet information sent to patient on a mychart message.

## 2021-12-30 NOTE — Assessment & Plan Note (Signed)
Stable. Has imitrex on hand for prn use.

## 2021-12-30 NOTE — Telephone Encounter (Signed)
See mychart.  

## 2021-12-30 NOTE — Assessment & Plan Note (Signed)
BP is at goal, continue amlodipine 5mg .

## 2021-12-30 NOTE — Telephone Encounter (Signed)
Potassium is low and sugar is a little high.  I would like her to focus on potassium rich foods in her diet.  Repeat BMET in 2 weeks (dx hypokalemia) and A1C, Dx hyperglycemia. Please send her the following list via mychart:   High potassium content foods  Highest content (>25 meq/100 g) High content (>6.2 meq/100 g)   Vegetables   Spinach   Tomatoes   Broccoli   Winter squash   Beets   Carrots   Cauliflower   Potatoes   Fruits   Bananas  Dried figs Cantaloupe  Molasses Kiwis  Seaweed Oranges  Very high content (>12.5 meq/100 g) Mangos  Dried fruits (dates, prunes) Meats  Nuts Ground beef  Avocados Steak  Bran cereals Pork  Wheat germ Veal  Lima beans Lamb

## 2021-12-30 NOTE — Progress Notes (Signed)
Subjective:     Patient ID: Tiffany Cline, female    DOB: [birth year], 61 y.o.   MRN: [id number]  Chief Complaint  Patient presents with   Hypertension    Here for follow up    Hypertension   Patient is in today for follow up.  HTN- maintained on amlodipine 5 mg once daily. Tolerating without difficulty.   BP Readings from Last 3 Encounters:  12/30/21 140/71  09/07/21 139/64  08/22/21 136/72   Migraines-  rare  Imitrex is helpful if needed.    She continues meloxicam daily.  States that she requires a full tablet once daily.    Has pap scheduled.   Arthritis- reports that she takes 15 mg of meloxicam daily for hip pain. Reports 7.5 mg has not been sufficient.    Health Maintenance Due  Topic Date Due   Fecal DNA (Cologuard)  06/08/2020   PAP SMEAR-Modifier  12/10/2021    Past Medical History:  Diagnosis Date   Asthma    induced by cold and allergies   Hypertension    Leiomyoma     Past Surgical History:  Procedure Laterality Date   MYOMECTOMY  2001    Family History  Problem Relation Age of Onset   Diabetes Mother    Hypertension Mother    Hypertension Father    Cancer Father        prostate (15 yrs ago)   Thyroid disease Father        benign tumor   Congestive Heart Failure Father        died from fall/head trauma at age 59   Heart disease Maternal Grandmother        50's   Hypertension Paternal Grandmother    Asthma Paternal Grandfather    Congestive Heart Failure Half-Brother    CAD Maternal Aunt        50's   CAD Maternal Aunt        50's    Social History   Socioeconomic History   Marital status: Single    Spouse name: n/a   Number of children: 0   Years of education: RPh   Highest education level: Not on file  Occupational History   Occupation: Pharmacist  Tobacco Use   Smoking status: Never   Smokeless tobacco: Never  Vaping Use   Vaping Use: Never used  Substance and Sexual Activity   Alcohol use: No   Drug use: No    Sexual activity: Not Currently    Birth control/protection: Pill  Other Topics Concern   Not on file  Social History Narrative   Lives alone with her pets. (2 dogs)   Family lives nearby.   Works part time as a Software engineer at [employer]   Enjoys travelling   Investment banker, operational of Sales executive: Not on [employer] Insecurity: Not on file  Transportation Needs: Not on file  Physical Activity: Not on file  Stress: Not on file  Social Connections: Not on file  Intimate Partner Violence: Not on file    Outpatient Medications Prior to Visit  Medication Sig Dispense Refill   albuterol (VENTOLIN HFA) 108 (90 Base) MCG/ACT inhaler Inhale 2 puffs into the lungs every 6 (six) hours as needed for wheezing or shortness of breath. 8 g 2   amLODipine (NORVASC) 5 MG tablet Take 1 tablet by mouth once daily 90 tablet 0   Astaxanthin 4 MG CAPS      Azelastine HCl 137  MCG/SPRAY SOLN PLACE 2 SPRAYS IN EACH NOTRIL TWICE A DAY AS NEEDED 30 mL 12   Collagen-Vitamin C-Biotin (COLLAGEN 1500/C PO)      cyclobenzaprine (FLEXERIL) 10 MG tablet TAKE 1 TABLET BY MOUTH THREE TIMES DAILY AS NEEDED FOR MUSCLE SPASMS 30 tablet 0   DULERA 200-5 MCG/ACT AERO      estradiol (ESTRACE) 0.5 MG tablet TAKE 1 TABLET BY MOUTH EVERY DAY 90 tablet 0   fluticasone (FLONASE) 50 MCG/ACT nasal spray USE TWO SPRAY(S) IN EACH NOSTRIL ONCE DAILY 16 g 11   Glucosamine-Chondroit-Vit C-Mn (GLUCOSAMINE CHONDROITIN COMPLX) CAPS      meloxicam (MOBIC) 15 MG tablet TAKE 1/2 TO 1 TABLET (7.5-15 MG TOTAL) BY MOUTH DAILY AS NEEDED FOR PAIN 30 tablet 6   mometasone-formoterol (DULERA) 200-5 MCG/ACT AERO Inhale 2 puffs into the lungs 2 (two) times daily. 1 each 6   montelukast (SINGULAIR) 10 MG tablet Take 1 tablet (10 mg total) by mouth at bedtime. 90 tablet 2   progesterone (PROMETRIUM) 100 MG capsule TAKE 1 CAPSULE BY MOUTH EVERY DAY 90 capsule 1   promethazine (PHENERGAN) 25 MG tablet Take 1 tablet (25 mg total) by mouth  every 8 (eight) hours as needed for nausea or vomiting. 20 tablet 0   Red Yeast Rice 600 MG CAPS      Resveratrol 100 MG CAPS      SUMAtriptan (IMITREX) 50 MG tablet MAY REPEATIN 2 HOURS IF HEADACHE PERSISTS OR RECURS 9 tablet 3   Coenzyme Q10 10 MG capsule      olopatadine (PATANOL) 0.1 % ophthalmic solution Place 1 drop into both eyes 2 (two) times daily. 5 mL 12   tobramycin (TOBREX) 0.3 % ophthalmic solution Place 2 drops into the left eye every 6 (six) hours. 5 mL 0   Facility-Administered Medications Prior to Visit  Medication Dose Route Frequency Provider Last Rate Last Admin   ipratropium (ATROVENT) nebulizer solution 0.5 mg  0.5 mg Nebulization Once Dunn, Ryan M, PA-C        Allergies  Allergen Reactions   Septra [Sulfamethoxazole-Trimethoprim] Shortness Of Breath and Rash   Latex Itching and Rash    Itching and redness with rash with use of latex gloves when she had blood drawn   Sulfa Antibiotics Rash    ROS    See HPI  Objective:    Physical Exam Constitutional:      General: She is not in acute distress.    Appearance: Normal appearance. She is well-developed.  HENT:     Head: Normocephalic and atraumatic.     Right Ear: External ear normal.     Left Ear: External ear normal.  Eyes:     General: No scleral icterus. Neck:     Thyroid: No thyromegaly.  Cardiovascular:     Rate and Rhythm: Normal rate and regular rhythm.     Heart sounds: Normal heart sounds. No murmur heard. Pulmonary:     Effort: Pulmonary effort is normal. No respiratory distress.     Breath sounds: Normal breath sounds. No wheezing.  Musculoskeletal:     Cervical back: Neck supple.  Skin:    General: Skin is warm and dry.  Neurological:     Mental Status: She is alert and oriented to person, place, and time.  Psychiatric:        Mood and Affect: Mood normal.        Behavior: Behavior normal.        Thought Content: Thought content normal.  Judgment: Judgment normal.    BP  140/71 (BP Location: Left Arm, Patient Position: Sitting, Cuff Size: Small)    Pulse (!) 56    Temp 98.1 F (36.7 C) (Oral)    Resp 16    Wt 138 lb 9.6 oz (62.9 kg)    LMP 07/22/2020 (LMP Unknown)    SpO2 100%    BMI 23.79 kg/m  Wt Readings from Last 3 Encounters:  12/30/21 138 lb 9.6 oz (62.9 kg)  09/07/21 144 lb (65.3 kg)  08/22/21 145 lb 9.6 oz (66 kg)       Assessment & Plan:   Problem List Items Addressed This Visit       Unprioritized   Positive colorectal cancer screening using Cologuard test    She has been referred to GI for colonoscopy multiple times but has not followed through with recommended colonoscopy. See mychart message.       Migraines    Stable. Has imitrex on hand for prn use.       Relevant Medications   SUMAtriptan (IMITREX) 50 MG tablet   Essential hypertension    BP is at goal, continue amlodipine 88m.       Arthritis    Recommended that she try to cut the meloxicam down to 1/2 tab (7.527m once daily with 100028mf tylenol to see if this holds her.  Discussed risks of GI bleed and kidney issues with prolonged use.       Other Visit Diagnoses     Primary hypertension    -  Primary   Relevant Orders   Comp Met (CMET)       I have discontinued Elizzie Weygandt "Lorr"'s Coenzyme Q10, olopatadine, and tobramycin. I am also having her maintain her fluticasone, promethazine, cyclobenzaprine, Glucosamine Chondroitin Complx, Collagen-Vitamin C-Biotin (COLLAGEN 1500/C PO), Astaxanthin, Red Yeast Rice, Resveratrol, Dulera, progesterone, mometasone-formoterol, montelukast, albuterol, Azelastine HCl, estradiol, meloxicam, amLODipine, and SUMAtriptan. We will continue to administer ipratropium.  Meds ordered this encounter  Medications   SUMAtriptan (IMITREX) 50 MG tablet    Sig: MAY REPEATIN 2 HOURS IF HEADACHE PERSISTS OR RECURS    Dispense:  9 tablet    Refill:  3    Order Specific Question:   Supervising Provider    Answer:   BLYPenni Homans[[id number]]

## 2021-12-30 NOTE — Assessment & Plan Note (Signed)
She has been referred to GI for colonoscopy multiple times but has not followed through with recommended colonoscopy. See mychart message.

## 2022-01-02 ENCOUNTER — Other Ambulatory Visit: Payer: Self-pay | Admitting: Nurse Practitioner

## 2022-01-02 DIAGNOSIS — N951 Menopausal and female climacteric states: Secondary | ICD-10-CM

## 2022-01-02 DIAGNOSIS — Z79890 Hormone replacement therapy (postmenopausal): Secondary | ICD-10-CM

## 2022-01-09 ENCOUNTER — Encounter: Payer: Self-pay | Admitting: Nurse Practitioner

## 2022-01-09 ENCOUNTER — Ambulatory Visit (INDEPENDENT_AMBULATORY_CARE_PROVIDER_SITE_OTHER): Payer: Managed Care, Other (non HMO) | Admitting: Nurse Practitioner

## 2022-01-09 ENCOUNTER — Other Ambulatory Visit: Payer: Self-pay

## 2022-01-09 VITALS — BP 132/82 | Ht 63.0 in | Wt 136.0 lb

## 2022-01-09 DIAGNOSIS — Z79890 Hormone replacement therapy (postmenopausal): Secondary | ICD-10-CM | POA: Diagnosis not present

## 2022-01-09 DIAGNOSIS — Z780 Asymptomatic menopausal state: Secondary | ICD-10-CM | POA: Diagnosis not present

## 2022-01-09 DIAGNOSIS — Z01419 Encounter for gynecological examination (general) (routine) without abnormal findings: Secondary | ICD-10-CM | POA: Diagnosis not present

## 2022-01-09 MED ORDER — PROGESTERONE MICRONIZED 100 MG PO CAPS: 100.0000 mg | ORAL_CAPSULE | Freq: Every evening | ORAL | 3 refills | Status: DC

## 2022-01-09 MED ORDER — ESTRADIOL 0.5 MG PO TABS: 0.5000 mg | ORAL_TABLET | Freq: Every day | ORAL | 3 refills | Status: DC

## 2022-01-09 NOTE — Progress Notes (Signed)
° °  Tiffany Cline March 14, 1961 [id number]   History:  62 y.o. G0 presents for annual exam. Postmenopausal - on HRT, no bleeding. HRT started mainly for generalized joint pain. She has had full improvement since starting. Normal pap history. HTN managed by PCP.  Gynecologic History Patient's last menstrual period was 07/22/2020 (lmp unknown).   Contraception/Family planning: post menopausal status Sexually active: No  Health Maintenance Last Pap: 12/10/2018. Results were: Normal, 5-year repeat Last mammogram: 12/21/2020. Results were: Normal  Last colonoscopy: Never. Negative Cologuard in 2018 Last Dexa: Not indicated  Past medical history, past surgical history, family history and social history were all reviewed and documented in the EPIC chart. 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   ROS:  A ROS was performed and pertinent positives and negatives are included.  Exam:  Vitals:   01/09/22 0858  BP: 132/82  Weight: 136 lb (61.7 kg)  Height: 5\' 3"  (1.6 m)   Body mass index is 24.09 kg/m.  General appearance:  Normal Thyroid:  Symmetrical, normal in size, without palpable masses or nodularity. Respiratory  Auscultation:  Clear without wheezing or rhonchi Cardiovascular  Auscultation:  Regular rate, without rubs, murmurs or gallops  Edema/varicosities:  Not grossly evident Abdominal  Soft,nontender, without masses, guarding or rebound.  Liver/spleen:  No organomegaly noted  Hernia:  None appreciated  Skin  Inspection:  Grossly normal Breasts: Examined lying and sitting.   Right: Without masses, retractions, nipple discharge or axillary adenopathy.   Left: Without masses, retractions, nipple discharge or axillary adenopathy. Genitourinary   Inguinal/mons:  Normal without inguinal adenopathy  External genitalia:  Normal appearing vulva with no masses, tenderness, or lesions  BUS/Urethra/Skene's glands:  Normal  Vagina:  Normal appearing with normal color and discharge, no  lesions  Cervix:  Normal appearing without discharge or lesions  Uterus:  Normal in size, shape and contour.  Midline and mobile, nontender  Adnexa/parametria:     Rt: Normal in size, without masses or tenderness.   Lt: Normal in size, without masses or tenderness.  Anus and perineum: Normal  Patient informed chaperone available to be present for breast and pelvic exam. Patient has requested no chaperone to be present. Patient has been advised what will be completed during breast and pelvic exam.   Assessment/Plan:  61 y.o. G0 for annual exam.   Well female exam with routine gynecological exam - Education provided on SBEs, importance of preventative screenings, current guidelines, high calcium diet, regular exercise, and multivitamin daily.  Labs with PCP.   Postmenopausal - on HRT, no bleeding.   Postmenopausal hormone therapy - Plan: progesterone (PROMETRIUM) 100 MG capsule nightly, estradiol (ESTRACE) 0.5 MG tablet daily. HRT started mainly for generalized joint pain. She has had full improvement since starting. She is aware of risk for blood clots, heart attack, stroke, and breast cancer with use, as well as the benefits of heart and bone health. She would like to continue. Refill x 1 year provided.   Screening for cervical cancer - Normal Pap history.  Will repeat at 5-year interval per guidelines.  Screening for breast cancer - Normal mammogram history.  Continue annual screenings.  Normal breast exam today. Mammogram scheduled 01/13/2022.  Screening for colon cancer - Negative Cologuard 2018. PCP recently ordered.   Screening for osteoporosis - Average risk. Will plan for DXA at age 80.   Return in 1 year for annual.     Tamela Gammon DNP, 9:20 AM 01/09/2022

## 2022-01-10 ENCOUNTER — Other Ambulatory Visit (HOSPITAL_BASED_OUTPATIENT_CLINIC_OR_DEPARTMENT_OTHER): Payer: Self-pay | Admitting: Family

## 2022-01-10 DIAGNOSIS — Z1231 Encounter for screening mammogram for malignant neoplasm of breast: Secondary | ICD-10-CM

## 2022-01-12 ENCOUNTER — Other Ambulatory Visit (HOSPITAL_BASED_OUTPATIENT_CLINIC_OR_DEPARTMENT_OTHER): Payer: Self-pay | Admitting: Nurse Practitioner

## 2022-01-12 DIAGNOSIS — Z1231 Encounter for screening mammogram for malignant neoplasm of breast: Secondary | ICD-10-CM

## 2022-01-13 ENCOUNTER — Encounter (HOSPITAL_BASED_OUTPATIENT_CLINIC_OR_DEPARTMENT_OTHER): Payer: Self-pay

## 2022-01-13 ENCOUNTER — Ambulatory Visit (HOSPITAL_BASED_OUTPATIENT_CLINIC_OR_DEPARTMENT_OTHER)
Admission: RE | Admit: 2022-01-13 | Discharge: 2022-01-13 | Disposition: A | Discharge status: Home / Self Care | Payer: Managed Care, Other (non HMO) | Source: Ambulatory Visit | Attending: Nurse Practitioner | Admitting: Nurse Practitioner

## 2022-01-13 ENCOUNTER — Other Ambulatory Visit (INDEPENDENT_AMBULATORY_CARE_PROVIDER_SITE_OTHER): Payer: Managed Care, Other (non HMO)

## 2022-01-13 ENCOUNTER — Other Ambulatory Visit: Payer: Self-pay

## 2022-01-13 DIAGNOSIS — R739 Hyperglycemia, unspecified: Secondary | ICD-10-CM | POA: Diagnosis not present

## 2022-01-13 DIAGNOSIS — E875 Hyperkalemia: Secondary | ICD-10-CM

## 2022-01-13 DIAGNOSIS — Z1231 Encounter for screening mammogram for malignant neoplasm of breast: Secondary | ICD-10-CM | POA: Diagnosis not present

## 2022-01-13 LAB — BASIC METABOLIC PANEL
BUN: 11 mg/dL (ref 6–23)
CO2: 33 mEq/L — ABNORMAL HIGH (ref 19–32)
Calcium: 9.1 mg/dL (ref 8.4–10.5)
Chloride: 102 mEq/L (ref 96–112)
Creatinine, Ser: 0.84 mg/dL (ref 0.40–1.20)
GFR: 76.42 mL/min (ref >60.00)
Glucose, Bld: 107 mg/dL — ABNORMAL HIGH (ref 70–99)
Potassium: 3.2 mEq/L — ABNORMAL LOW (ref 3.5–5.1)
Sodium: 141 mEq/L (ref 135–145)

## 2022-01-13 LAB — HEMOGLOBIN A1C: Hgb A1c MFr Bld: 6.1 % (ref 4.6–6.5)

## 2022-01-14 ENCOUNTER — Telehealth: Payer: Self-pay | Admitting: Family

## 2022-01-14 ENCOUNTER — Encounter: Payer: Self-pay | Admitting: Family

## 2022-01-14 DIAGNOSIS — R739 Hyperglycemia, unspecified: Secondary | ICD-10-CM | POA: Insufficient documentation

## 2022-01-14 MED ORDER — POTASSIUM CHLORIDE CRYS ER 10 MEQ PO TBCR: 10.0000 meq | EXTENDED_RELEASE_TABLET | Freq: Every day | ORAL | 3 refills | Status: DC

## 2022-01-14 NOTE — Telephone Encounter (Signed)
Sugar is in the pre-diabetic range. Please work on a healthy diet (reduce sugars/carbs) and exercise.   Potassium is still low. I would like her to add Kdur 10 mEQ once daily and repeat bmet in 2 weeks, dx hypokalemia.

## 2022-01-16 ENCOUNTER — Other Ambulatory Visit: Payer: Self-pay

## 2022-01-16 DIAGNOSIS — E876 Hypokalemia: Secondary | ICD-10-CM

## 2022-01-16 NOTE — Telephone Encounter (Signed)
Pt aware, lab ordered and lab apt scheduled.

## 2022-01-25 LAB — COLOGUARD
COLOGUARD: NEGATIVE
Cologuard: NEGATIVE

## 2022-01-30 ENCOUNTER — Other Ambulatory Visit (INDEPENDENT_AMBULATORY_CARE_PROVIDER_SITE_OTHER): Payer: Managed Care, Other (non HMO)

## 2022-01-30 DIAGNOSIS — E876 Hypokalemia: Secondary | ICD-10-CM

## 2022-01-30 LAB — BASIC METABOLIC PANEL
BUN: 14 mg/dL (ref 6–23)
CO2: 30 mEq/L (ref 19–32)
Calcium: 9 mg/dL (ref 8.4–10.5)
Chloride: 101 mEq/L (ref 96–112)
Creatinine, Ser: 0.82 mg/dL (ref 0.40–1.20)
GFR: 78.64 mL/min (ref >60.00)
Glucose, Bld: 92 mg/dL (ref 70–99)
Potassium: 3.5 mEq/L (ref 3.5–5.1)
Sodium: 140 mEq/L (ref 135–145)

## 2022-01-31 ENCOUNTER — Encounter: Payer: Self-pay | Admitting: Family

## 2022-03-12 ENCOUNTER — Other Ambulatory Visit: Payer: Self-pay | Admitting: Family

## 2022-04-04 ENCOUNTER — Other Ambulatory Visit: Payer: Self-pay | Admitting: Family

## 2022-05-06 ENCOUNTER — Other Ambulatory Visit: Payer: Self-pay | Admitting: Family

## 2022-05-09 ENCOUNTER — Telehealth: Payer: Self-pay | Admitting: Pulmonary Disease

## 2022-05-10 MED ORDER — BUDESONIDE-FORMOTEROL FUMARATE 160-4.5 MCG/ACT IN AERO: 2.0000 | INHALATION_SPRAY | Freq: Two times a day (BID) | RESPIRATORY_TRACT | 11 refills | Status: DC

## 2022-05-10 NOTE — Telephone Encounter (Signed)
Called and spoke with patient who is calling and asking for a refill of her symbicort. I have refilled her symbicort to preferred pharmacy and also got her scheduled for an OV with Dr. Vaughan Browner. Nothing further needed at this time.

## 2022-06-08 ENCOUNTER — Other Ambulatory Visit: Payer: Self-pay | Admitting: Family

## 2022-06-08 DIAGNOSIS — I10 Essential (primary) hypertension: Secondary | ICD-10-CM

## 2022-06-27 ENCOUNTER — Ambulatory Visit (INDEPENDENT_AMBULATORY_CARE_PROVIDER_SITE_OTHER): Payer: Managed Care, Other (non HMO) | Admitting: Family

## 2022-06-27 VITALS — BP 121/61 | HR 71 | Temp 98.9°F | Resp 16 | Wt 135.0 lb

## 2022-06-27 DIAGNOSIS — I10 Essential (primary) hypertension: Secondary | ICD-10-CM | POA: Diagnosis not present

## 2022-06-27 DIAGNOSIS — R739 Hyperglycemia, unspecified: Secondary | ICD-10-CM

## 2022-06-27 DIAGNOSIS — G43909 Migraine, unspecified, not intractable, without status migrainosus: Secondary | ICD-10-CM

## 2022-06-27 DIAGNOSIS — J4540 Moderate persistent asthma, uncomplicated: Secondary | ICD-10-CM

## 2022-06-27 DIAGNOSIS — J3089 Other allergic rhinitis: Secondary | ICD-10-CM

## 2022-06-27 LAB — BASIC METABOLIC PANEL
BUN: 12 mg/dL (ref 6–23)
CO2: 32 mEq/L (ref 19–32)
Calcium: 9.1 mg/dL (ref 8.4–10.5)
Chloride: 102 mEq/L (ref 96–112)
Creatinine, Ser: 0.79 mg/dL (ref 0.40–1.20)
GFR: 82 mL/min (ref >60.00)
Glucose, Bld: 87 mg/dL (ref 70–99)
Potassium: 3.6 mEq/L (ref 3.5–5.1)
Sodium: 140 mEq/L (ref 135–145)

## 2022-06-27 LAB — HEMOGLOBIN A1C: Hgb A1c MFr Bld: 6.3 % (ref 4.6–6.5)

## 2022-06-27 NOTE — Assessment & Plan Note (Signed)
Stable, continues to follow with pulmonology.

## 2022-06-27 NOTE — Assessment & Plan Note (Signed)
Stable on singulair. Continue same.

## 2022-06-27 NOTE — Assessment & Plan Note (Signed)
Stable with prn use of sumatriptan.  Stable.

## 2022-06-27 NOTE — Progress Notes (Signed)
Subjective:   By signing my name below, I, Tiffany Cline, attest that this documentation has been prepared under the direction and in the presence of Tiffany Chimera, NP 06/27/2022   Patient ID: Tiffany Cline, female    DOB: 23-Jun-1961, 61 y.o.   MRN: [id number]  Chief Complaint  Patient presents with   Hypertension    Here for follow up    HPI Patient is in today for an office visit   Blood Pressure: Her blood pressure levels are normal. She is currently taking 5 Mg of Amlodipine.  BP Readings from Last 3 Encounters:  06/27/22 121/61  01/09/22 132/82  12/30/21 140/71   Pulse Readings from Last 3 Encounters:  06/27/22 71  12/30/21 (!) 56  09/07/21 79   Allergies: She reports that her allergie symptoms are controlled. She was previously taking Navage and reports that symptoms were controlled. She is currently taking '10Mg'$  of Singulair. She cannot take daily antihistamines because they give her a migraine.  Migraines: She states that she would feel the beginning of a migraine and know how to prevent any worsening of symptoms. She states that worsening migraines are not frequent. She is currently taking 50 Mg of Imitrex.  Asthma: Her asthma is controlled and reports no recent flare-ups. She is following up with her specialist next month.  Cologuard: She reports that she has had two Cologuards with her most recent being negative. She states that her previous one was positive but could be due to experiencing hemorrhoids at that time.  Pap Smear:She regularly goes to [facility] for pap smears. She is due for a pap smear.   Health Maintenance Due  Topic Date Due   COVID-19 Vaccine (6 - Moderna series) 10/30/2021   PAP SMEAR-Modifier  12/10/2021   INFLUENZA VACCINE  06/27/2022    Past Medical History:  Diagnosis Date   Asthma    induced by cold and allergies   Hyperglycemia    Hypertension    Leiomyoma     Past Surgical History:  Procedure Laterality Date    MYOMECTOMY  2001    Family History  Problem Relation Age of Onset   Diabetes Mother    Hypertension Mother    Hypertension Father    Cancer Father        prostate (15 yrs ago)   Thyroid disease Father        benign tumor   Congestive Heart Failure Father        died from fall/head trauma at age 68   Heart disease Maternal Grandmother        50's   Hypertension Paternal Grandmother    Asthma Paternal Grandfather    Congestive Heart Failure Half-Brother    CAD Maternal Aunt        50's   CAD Maternal Aunt        50's    Social History   Socioeconomic History   Marital status: Single    Spouse name: n/a   Number of children: 0   Years of education: RPh   Highest education level: Not on file  Occupational History   Occupation: Pharmacist  Tobacco Use   Smoking status: Never   Smokeless tobacco: Never  Vaping Use   Vaping Use: Never used  Substance and Sexual Activity   Alcohol use: No   Drug use: No   Sexual activity: Not Currently  Other Topics Concern   Not on file  Social History Narrative   Lives alone  with her pets. (2 dogs)   Family lives nearby.   Works part time as a Software engineer at [employer]   Enjoys travelling   Investment banker, operational of Sales executive: Not on [employer] Insecurity: Not on file  Transportation Needs: Not on file  Physical Activity: Not on file  Stress: Not on file  Social Connections: Not on file  Intimate Partner Violence: Not on file    Outpatient Medications Prior to Visit  Medication Sig Dispense Refill   albuterol (VENTOLIN HFA) 108 (90 Base) MCG/ACT inhaler Inhale 2 puffs into the lungs every 6 (six) hours as needed for wheezing or shortness of breath. 8 g 2   amLODipine (NORVASC) 5 MG tablet Take 1 tablet by mouth once daily 90 tablet 0   Astaxanthin 4 MG CAPS      Azelastine HCl 137 MCG/SPRAY SOLN USE 2 SPRAY(S) IN EACH NOSTRIL TWICE DAILY AS NEEDED 30 mL 2   budesonide-formoterol (SYMBICORT) 160-4.5 MCG/ACT  inhaler Inhale 2 puffs into the lungs 2 (two) times daily. 10.2 g 11   Collagen-Vitamin C-Biotin (COLLAGEN 1500/C PO)      estradiol (ESTRACE) 0.5 MG tablet Take 1 tablet (0.5 mg total) by mouth daily. 90 tablet 3   fluticasone (FLONASE) 50 MCG/ACT nasal spray USE TWO SPRAY(S) IN EACH NOSTRIL ONCE DAILY 16 g 11   Glucosamine-Chondroit-Vit C-Mn (GLUCOSAMINE CHONDROITIN COMPLX) CAPS      meloxicam (MOBIC) 15 MG tablet TAKE 1/2 TO 1 TABLET (7.5-15 MG TOTAL) BY MOUTH DAILY AS NEEDED FOR PAIN 30 tablet 6   montelukast (SINGULAIR) 10 MG tablet Take 1 tablet (10 mg total) by mouth at bedtime. 90 tablet 2   potassium chloride (KLOR-CON M) 10 MEQ tablet Take 1 tablet (10 mEq total) by mouth daily. 30 tablet 3   progesterone (PROMETRIUM) 100 MG capsule Take 1 capsule (100 mg total) by mouth at bedtime. 90 capsule 3   promethazine (PHENERGAN) 25 MG tablet Take 1 tablet (25 mg total) by mouth every 8 (eight) hours as needed for nausea or vomiting. 20 tablet 0   Red Yeast Rice 600 MG CAPS      Resveratrol 100 MG CAPS      SUMAtriptan (IMITREX) 50 MG tablet MAY REPEATIN 2 HOURS IF HEADACHE PERSISTS OR RECURS 9 tablet 3   cyclobenzaprine (FLEXERIL) 10 MG tablet TAKE 1 TABLET BY MOUTH THREE TIMES DAILY AS NEEDED FOR MUSCLE SPASMS 30 tablet 0   DULERA 200-5 MCG/ACT AERO      mometasone-formoterol (DULERA) 200-5 MCG/ACT AERO Inhale 2 puffs into the lungs 2 (two) times daily. 1 each 6   Facility-Administered Medications Prior to Visit  Medication Dose Route Frequency Provider Last Rate Last Admin   ipratropium (ATROVENT) nebulizer solution 0.5 mg  0.5 mg Nebulization Once Dunn, Ryan M, PA-C        Allergies  Allergen Reactions   Septra [Sulfamethoxazole-Trimethoprim] Shortness Of Breath and Rash   Latex Itching and Rash    Itching and redness with rash with use of latex gloves when she had blood drawn   Sulfa Antibiotics Rash    ROS See HPI    Objective:    Physical Exam Constitutional:       General: She is not in acute distress.    Appearance: Normal appearance. She is not ill-appearing.  HENT:     Head: Normocephalic and atraumatic.     Right Ear: External ear normal.     Left Ear: External ear normal.  Eyes:     Extraocular Movements: Extraocular movements intact.     Pupils: Pupils are equal, round, and reactive to light.  Neck:     Thyroid: No thyromegaly.  Cardiovascular:     Rate and Rhythm: Normal rate and regular rhythm.     Heart sounds: Normal heart sounds. No murmur heard.    No gallop.  Pulmonary:     Effort: Pulmonary effort is normal. No respiratory distress.     Breath sounds: Normal breath sounds. No wheezing or rales.  Lymphadenopathy:     Cervical: No cervical adenopathy.  Skin:    General: Skin is warm and dry.  Neurological:     Mental Status: She is alert and oriented to person, place, and time.  Psychiatric:        Mood and Affect: Mood normal.        Behavior: Behavior normal.        Judgment: Judgment normal.     BP 121/61 (BP Location: Right Arm, Patient Position: Sitting, Cuff Size: Small)   Pulse 71   Temp 98.9 F (37.2 C) (Oral)   Resp 16   Wt 135 lb (61.2 kg)   LMP 07/22/2020 (LMP Unknown)   SpO2 100%   BMI 23.91 kg/m  Wt Readings from Last 3 Encounters:  06/27/22 135 lb (61.2 kg)  01/09/22 136 lb (61.7 kg)  12/30/21 138 lb 9.6 oz (62.9 kg)       Assessment & Plan:   Problem List Items Addressed This Visit       Unprioritized   Migraines    Stable with prn use of sumatriptan.  Stable.      Hyperglycemia - Primary   Relevant Orders   Hemoglobin A1c (Completed)   Essential hypertension    BP Readings from Last 3 Encounters:  06/27/22 121/61  01/09/22 132/82  12/30/21 140/71  Stable on amlodipine '5mg'$ .  Continue same.        Relevant Orders   Basic metabolic panel (Completed)   Asthma, chronic    Stable, continues to follow with pulmonology.       Allergic rhinitis    Stable on singulair. Continue same.           No orders of the defined types were placed in this encounter.   I, Nance Pear, NP, personally preformed the services described in this documentation.  All medical record entries made by the scribe were at my direction and in my presence.  I have reviewed the chart and discharge instructions (if applicable) and agree that the record reflects my personal performance and is accurate and complete. 06/27/2022   I,Amber Collins,acting as a scribe for Nance Pear, NP.,have documented all relevant documentation on the behalf of Nance Pear, NP,as directed by  Nance Pear, NP while in the presence of Nance Pear, NP.    Nance Pear, NP

## 2022-06-27 NOTE — Assessment & Plan Note (Signed)
BP Readings from Last 3 Encounters:  06/27/22 121/61  01/09/22 132/82  12/30/21 140/71   Stable on amlodipine '5mg'$ .  Continue same.

## 2022-06-30 ENCOUNTER — Ambulatory Visit: Payer: Managed Care, Other (non HMO) | Admitting: Family

## 2022-08-11 ENCOUNTER — Encounter: Payer: Self-pay | Admitting: Pulmonary Disease

## 2022-08-11 ENCOUNTER — Ambulatory Visit (INDEPENDENT_AMBULATORY_CARE_PROVIDER_SITE_OTHER): Payer: Managed Care, Other (non HMO) | Admitting: Pulmonary Disease

## 2022-08-11 VITALS — BP 128/78 | HR 58 | Temp 98.5°F | Ht 64.0 in | Wt 134.8 lb

## 2022-08-11 DIAGNOSIS — J4541 Moderate persistent asthma with (acute) exacerbation: Secondary | ICD-10-CM

## 2022-08-11 NOTE — Progress Notes (Signed)
Tiffany Cline    [id number]    12-Mar-1961  Primary Care Physician:Tiffany Cline, Tiffany Sciara, NP  Referring Physician: Debbrah Alar, NP [address] [place],  [place] [address]  Chief complaint:  Follow up for Moderate persistent asthma  HPI: Tiffany Cline is a 61 year old with past medical history of asthma, hypertension. She was diagnosed around 2011 and has been maintained on Advair and more recently [facility] She has good control for most year but does have worsening of symptoms in wintertime. She has seasonal allergies, occasional postnasal drip. She denies any heartburn symptoms. She is allergic to cats. She has a dog at home but is not sensitive to it. 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  2018-Dulera reduced to 100 but had to go back up due to increased dyspnea, wheezing.   Interim History: Unable to get [place] and advair through insurance.  She is currently on Symbicort with good control of medication Continues on Singulair             Outpatient Encounter Medications as of 08/11/2022  Medication Sig   albuterol (VENTOLIN HFA) 108 (90 Base) MCG/ACT inhaler Inhale 2 puffs into the lungs every 6 (six) hours as needed for wheezing or shortness of breath.   amLODipine (NORVASC) 5 MG tablet Take 1 tablet by mouth once daily   Astaxanthin 4 MG CAPS    Azelastine HCl 137 MCG/SPRAY SOLN USE 2 SPRAY(S) IN EACH NOSTRIL TWICE DAILY AS NEEDED   budesonide-formoterol (SYMBICORT) 160-4.5 MCG/ACT inhaler Inhale 2 puffs into the lungs 2 (two) times daily.   Collagen-Vitamin C-Biotin (COLLAGEN 1500/C PO)    estradiol (ESTRACE) 0.5 MG tablet Take 1 tablet (0.5 mg total) by mouth daily.   fluticasone (FLONASE) 50 MCG/ACT nasal spray USE TWO SPRAY(S) IN EACH NOSTRIL ONCE DAILY   Glucosamine-Chondroit-Vit C-Mn (GLUCOSAMINE CHONDROITIN COMPLX) CAPS    meloxicam (MOBIC) 15 MG tablet TAKE 1/2 TO 1 TABLET (7.5-15 MG TOTAL) BY  MOUTH DAILY AS NEEDED FOR PAIN   montelukast (SINGULAIR) 10 MG tablet Take 1 tablet (10 mg total) by mouth at bedtime.   potassium chloride (KLOR-CON M) 10 MEQ tablet Take 1 tablet (10 mEq total) by mouth daily.   progesterone (PROMETRIUM) 100 MG capsule Take 1 capsule (100 mg total) by mouth at bedtime.   promethazine (PHENERGAN) 25 MG tablet Take 1 tablet (25 mg total) by mouth every 8 (eight) hours as needed for nausea or vomiting.   Red Yeast Rice 600 MG CAPS    Resveratrol 100 MG CAPS    SUMAtriptan (IMITREX) 50 MG tablet MAY REPEATIN 2 HOURS IF HEADACHE PERSISTS OR RECURS   Facility-Administered Encounter Medications as of 08/11/2022  Medication   ipratropium (ATROVENT) nebulizer solution 0.5 mg    Physical Exam: Blood pressure 128/78, pulse (!) 58, temperature 98.5 F (36.9 C), temperature source Oral, height '5\' 4"'$  (1.626 m), weight 134 lb 12.8 oz (61.1 kg), last menstrual period 07/22/2020, SpO2 100 %. Gen:      No acute distress HEENT:  EOMI, sclera anicteric Neck:     No masses; no thyromegaly Lungs:    Clear to auscultation bilaterally; normal respiratory effort CV:         Regular rate and rhythm; no murmurs Abd:      + bowel sounds; soft, non-tender; no palpable masses, no distension Ext:    No edema; adequate peripheral perfusion Skin:  Warm and dry; no rash Neuro: alert and oriented x 3 Psych: normal mood and affect   Data Reviewed: Imaging Chest x-ray 04/12/16- Mild rt apical scarring. Chest x-ray 08/09/18- mild hyperinflation with stable linear scarring. I have reviewed the images personally.  PFTs  06/02/15 FVC 3.02 [110%), FEV1 2.51 (115%), F/F 83 Normal spirometry  FENO 06/15/16-10  Labs CBC with diff 06/15/16. WBC 5.9, absolute eosinophil count- 112 Blood allergy profile- 06/15/16- Negative, IgE 42  Act score 08/10/2020-24 ACT score 08/22/2021 -- 21  Assessment:  Moderate persistent asthma Has not been on Dulera since November 2021 due to back order.  Using Advair as a replacement. Symptoms remains stable with occasional use of rescue inhaler. She'll continue on albuterol inhaler as needed.   Sinusitis, Allergic rhinitis Treating with Singulair, Flonase as needed    Plan/Recommendations: - Continue Symbicort - Continue Singulair, Flonase  Follow-up in 1 year                                                                                                   Marshell Garfinkel MD [facility] 08/11/2022, 9:11 AM  CC: Tiffany Alar, NP

## 2022-08-11 NOTE — Patient Instructions (Signed)
Glad you are doing well with your breathing Continue the Symbicort and Singulair inhaler Follow-up in 1 year.

## 2022-08-12 ENCOUNTER — Other Ambulatory Visit: Payer: Self-pay | Admitting: Family

## 2022-08-29 ENCOUNTER — Other Ambulatory Visit: Payer: Self-pay | Admitting: Family

## 2022-08-29 ENCOUNTER — Other Ambulatory Visit: Payer: Self-pay | Admitting: Pulmonary Disease

## 2022-08-29 DIAGNOSIS — I10 Essential (primary) hypertension: Secondary | ICD-10-CM

## 2022-08-29 DIAGNOSIS — J4541 Moderate persistent asthma with (acute) exacerbation: Secondary | ICD-10-CM

## 2022-11-11 ENCOUNTER — Other Ambulatory Visit: Payer: Self-pay | Admitting: Nurse Practitioner

## 2022-11-11 ENCOUNTER — Other Ambulatory Visit: Payer: Self-pay | Admitting: Family

## 2022-11-11 DIAGNOSIS — I10 Essential (primary) hypertension: Secondary | ICD-10-CM

## 2022-11-11 DIAGNOSIS — Z79890 Hormone replacement therapy (postmenopausal): Secondary | ICD-10-CM

## 2022-11-13 NOTE — Telephone Encounter (Signed)
Med refill request: estradiol 0.5 mg tab PO daily & progesterone 100 mg cap at HS Last AEX: 01/09/22/ TW Next AEX: Not scheduled  Last MMG (if hormonal med) 01/13/22, BiRads 1 neg  Rx pended for 90 day supply. Will need updated Annual exam for future refills   Refill authorized: Please Advise?

## 2022-11-15 ENCOUNTER — Telehealth: Payer: Managed Care, Other (non HMO) | Admitting: Physician Assistant

## 2022-11-15 DIAGNOSIS — R109 Unspecified abdominal pain: Secondary | ICD-10-CM

## 2022-11-16 NOTE — Progress Notes (Signed)
Because of flank pain (pain in kidney area), I feel your condition warrants further evaluation and I recommend that you be seen for a face to face visit. You need a urinalysis and culture to make sure that the best antibiotic for your infection is given to prevent progression to a more complicated UTI/kidney infection.  Please contact your primary care physician practice to be seen. Many offices offer virtual options to be seen via video if you are not comfortable going in person to a medical facility at this time.  NOTE: You will NOT be charged for this eVisit.  If you do not have a PCP, [facility] offers a free physician referral service available at [phone]. Our trained staff has the experience, knowledge and resources to put you in touch with a physician who is right for you.    If you are having a true medical emergency please call 911.   Your e-visit answers were reviewed by a board certified advanced clinical practitioner to complete your personal care plan.  Thank you for using e-Visits.

## 2022-11-29 ENCOUNTER — Ambulatory Visit (INDEPENDENT_AMBULATORY_CARE_PROVIDER_SITE_OTHER): Payer: Managed Care, Other (non HMO) | Admitting: Family

## 2022-11-29 VITALS — BP 118/73 | HR 62 | Temp 98.7°F | Resp 16 | Wt 137.0 lb

## 2022-11-29 DIAGNOSIS — J4540 Moderate persistent asthma, uncomplicated: Secondary | ICD-10-CM | POA: Diagnosis not present

## 2022-11-29 DIAGNOSIS — L659 Nonscarring hair loss, unspecified: Secondary | ICD-10-CM | POA: Insufficient documentation

## 2022-11-29 DIAGNOSIS — G43909 Migraine, unspecified, not intractable, without status migrainosus: Secondary | ICD-10-CM | POA: Diagnosis not present

## 2022-11-29 DIAGNOSIS — R739 Hyperglycemia, unspecified: Secondary | ICD-10-CM

## 2022-11-29 DIAGNOSIS — M2550 Pain in unspecified joint: Secondary | ICD-10-CM | POA: Diagnosis not present

## 2022-11-29 DIAGNOSIS — I10 Essential (primary) hypertension: Secondary | ICD-10-CM

## 2022-11-29 LAB — BASIC METABOLIC PANEL
BUN: 14 mg/dL (ref 6–23)
CO2: 29 mEq/L (ref 19–32)
Calcium: 8.7 mg/dL (ref 8.4–10.5)
Chloride: 102 mEq/L (ref 96–112)
Creatinine, Ser: 0.75 mg/dL (ref 0.40–1.20)
GFR: 87.02 mL/min (ref >60.00)
Glucose, Bld: 98 mg/dL (ref 70–99)
Potassium: 3.2 mEq/L — ABNORMAL LOW (ref 3.5–5.1)
Sodium: 141 mEq/L (ref 135–145)

## 2022-11-29 LAB — HEMOGLOBIN A1C: Hgb A1c MFr Bld: 5.9 % (ref 4.6–6.5)

## 2022-11-29 LAB — SEDIMENTATION RATE: Sed Rate: 11 mm/hr (ref 0–30)

## 2022-11-29 NOTE — Assessment & Plan Note (Signed)
BP Readings from Last 3 Encounters:  11/29/22 118/73  08/11/22 128/78  06/27/22 121/61   Maintained on amlodipine '5mg'$ . Continue same.

## 2022-11-29 NOTE — Assessment & Plan Note (Signed)
New.  Refer to dermatology. 

## 2022-11-29 NOTE — Assessment & Plan Note (Signed)
Stable on symbicort, singulair  and prn albuterol prn.

## 2022-11-29 NOTE — Progress Notes (Signed)
Subjective:   By signing my name below, I, [name], attest that this documentation has been prepared under the direction and in the presence of Debbrah Alar, NP. 11/29/2022   Patient ID: Tiffany Cline, female    DOB: 1961-03-08, 62 y.o.   MRN: [id number]  Chief Complaint  Patient presents with   Alopecia    Complains of hair loss    HPI Patient is in today for a office visit.   Hair loss: She complains of hair loss. She noticed hair falling out while showering. She also has a developing bald spot on the top of her scalp. She was prescribed clobetasol shampoo from her orthopedist specialist. Her orthopedist specialist think her symptoms are auto-immune. She has joint pain and conjunctivitis. She has a family history of hair loss in her family and noted that most women with hair loss in her family has a history of diabetes.   Asthma: She is managing her asthma well at this time while taking Symbicort 2x daily and Singulair. She has albuterol in case her symptoms worsen.   Migraine: She reports having recent episodes of mild migraines but did not feel the need to take Imitrex for them.   Blood pressure: Her blood pressure is doing well while taking 5 mg amlodipine daily PO. She is no longer taking the potassium supplements. She stopped taking them due to having on history of low potassium and not taking diuretics.  BP Readings from Last 3 Encounters:  11/29/22 118/73  08/11/22 128/78  06/27/22 121/61   Pulse Readings from Last 3 Encounters:  11/29/22 62  08/11/22 (!) 58  06/27/22 71   Diet: She is managing a healthy diet at this time.   GYN: She continues following up with her GYN specialist for pap smears. She has not received a pap smear in the past year.    Past Medical History:  Diagnosis Date   Asthma    induced by cold and allergies   Hyperglycemia    Hypertension    Leiomyoma     Past Surgical History:  Procedure Laterality Date   MYOMECTOMY  2001     Family History  Problem Relation Age of Onset   Diabetes Mother    Hypertension Mother    Hypertension Father    Cancer Father        prostate (72 yrs ago)   Thyroid disease Father        benign tumor   Congestive Heart Failure Father        died from fall/head trauma at age 73   Heart disease Maternal Grandmother        50's   Hypertension Paternal Grandmother    Asthma Paternal Grandfather    Congestive Heart Failure Half-Brother    CAD Maternal Aunt        50's   CAD Maternal Aunt        50's    Social History   Socioeconomic History   Marital status: Single    Spouse name: n/a   Number of children: 0   Years of education: RPh   Highest education level: Not on file  Occupational History   Occupation: Pharmacist  Tobacco Use   Smoking status: Never   Smokeless tobacco: Never  Vaping Use   Vaping Use: Never used  Substance and Sexual Activity   Alcohol use: No   Drug use: No   Sexual activity: Not Currently  Other Topics Concern   Not on file  Social History Narrative   Lives alone with her pets. (2 dogs)   Family lives nearby.   Works part time as a Software engineer at [employer]   Enjoys travelling   Investment banker, operational of Sales executive: Not on [employer] Insecurity: Not on file  Transportation Needs: Not on file  Physical Activity: Not on file  Stress: Not on file  Social Connections: Not on file  Intimate Partner Violence: Not on file    Outpatient Medications Prior to Visit  Medication Sig Dispense Refill   albuterol (VENTOLIN HFA) 108 (90 Base) MCG/ACT inhaler Inhale 2 puffs into the lungs every 6 (six) hours as needed for wheezing or shortness of breath. 8 g 2   amLODipine (NORVASC) 5 MG tablet Take 1 tablet by mouth once daily 90 tablet 0   Astaxanthin 4 MG CAPS      Azelastine HCl 137 MCG/SPRAY SOLN USE 2 SPRAY(S) IN EACH NOSTRIL TWICE DAILY AS NEEDED 30 mL 0   budesonide-formoterol (SYMBICORT) 160-4.5 MCG/ACT inhaler Inhale 2  puffs into the lungs 2 (two) times daily. 10.2 g 11   clobetasol (TEMOVATE) 0.05 % external solution Apply 1 Application topically 2 (two) times daily.     Collagen-Vitamin C-Biotin (COLLAGEN 1500/C PO)      estradiol (ESTRACE) 0.5 MG tablet Take 1 tablet by mouth once daily 90 tablet 0   fluticasone (FLONASE) 50 MCG/ACT nasal spray USE TWO SPRAY(S) IN EACH NOSTRIL ONCE DAILY 16 g 11   Glucosamine-Chondroit-Vit C-Mn (GLUCOSAMINE CHONDROITIN COMPLX) CAPS      meloxicam (MOBIC) 15 MG tablet TAKE 1/2 TO 1 (ONE-HALF TO ONE) TABLET BY MOUTH ONCE DAILY AS NEEDED FOR PAIN 30 tablet 0   montelukast (SINGULAIR) 10 MG tablet TAKE 1 TABLET BY MOUTH AT BEDTIME 90 tablet 3   progesterone (PROMETRIUM) 100 MG capsule Take 1 capsule by mouth at bedtime 90 capsule 0   promethazine (PHENERGAN) 25 MG tablet Take 1 tablet (25 mg total) by mouth every 8 (eight) hours as needed for nausea or vomiting. 20 tablet 0   Red Yeast Rice 600 MG CAPS      Resveratrol 100 MG CAPS      SUMAtriptan (IMITREX) 50 MG tablet MAY REPEATIN 2 HOURS IF HEADACHE PERSISTS OR RECURS 9 tablet 3   potassium chloride (KLOR-CON M) 10 MEQ tablet Take 1 tablet (10 mEq total) by mouth daily. 30 tablet 3   Facility-Administered Medications Prior to Visit  Medication Dose Route Frequency Provider Last Rate Last Admin   ipratropium (ATROVENT) nebulizer solution 0.5 mg  0.5 mg Nebulization Once Dunn, Ryan M, PA-C        Allergies  Allergen Reactions   Septra [Sulfamethoxazole-Trimethoprim] Shortness Of Breath and Rash   Latex Itching and Rash    Itching and redness with rash with use of latex gloves when she had blood drawn   Sulfa Antibiotics Rash    Review of Systems  Musculoskeletal:  Positive for joint pain.  Skin:        (+)hair loss on scalp       Objective:    Physical Exam Constitutional:      General: She is not in acute distress.    Appearance: Normal appearance. She is not ill-appearing.  HENT:     Head: Normocephalic  and atraumatic.     Right Ear: External ear normal.     Left Ear: External ear normal.  Eyes:     Extraocular Movements: Extraocular movements intact.  Pupils: Pupils are equal, round, and reactive to light.  Cardiovascular:     Rate and Rhythm: Normal rate and regular rhythm.     Heart sounds: Normal heart sounds. No murmur heard.    No gallop.  Pulmonary:     Effort: Pulmonary effort is normal. No respiratory distress.     Breath sounds: Normal breath sounds. No wheezing or rales.  Skin:    General: Skin is warm and dry.     Comments: Hair thinning on top of scalp  Neurological:     Mental Status: She is alert and oriented to person, place, and time.  Psychiatric:        Judgment: Judgment normal.     BP 118/73 (BP Location: Right Arm, Patient Position: Sitting, Cuff Size: Small)   Pulse 62   Temp 98.7 F (37.1 C) (Oral)   Resp 16   Wt 137 lb (62.1 kg)   LMP 07/22/2020 (LMP Unknown)   SpO2 100%   BMI 23.52 kg/m  Wt Readings from Last 3 Encounters:  11/29/22 137 lb (62.1 kg)  08/11/22 134 lb 12.8 oz (61.1 kg)  06/27/22 135 lb (61.2 kg)       Assessment & Plan:  Arthralgia, unspecified joint -     ANA -     Sedimentation rate -     Rheumatoid factor  Alopecia Assessment & Plan: New. Refer to dermatology.   Orders: -     Ambulatory referral to Dermatology  Moderate persistent chronic asthma without complication Assessment & Plan: Stable on symbicort, singulair  and prn albuterol prn.   Migraine without status migrainosus, not intractable, unspecified migraine type Assessment & Plan: Mild HA's, no recent need for imitrex.  Stable.    Essential hypertension Assessment & Plan: BP Readings from Last 3 Encounters:  11/29/22 118/73  08/11/22 128/78  06/27/22 121/61   Maintained on amlodipine '5mg'$ . Continue same.    Hyperglycemia -     Basic metabolic panel -     Hemoglobin A1c    I, Nance Pear, NP, personally preformed the services  described in this documentation.  All medical record entries made by the scribe were at my direction and in my presence.  I have reviewed the chart and discharge instructions (if applicable) and agree that the record reflects my personal performance and is accurate and complete. 11/29/2022   I,[name],acting as a Education administrator for Nance Pear, NP.,have documented all relevant documentation on the behalf of Nance Pear, NP,as directed by  Nance Pear, NP while in the presence of Nance Pear, NP.   Nance Pear, NP

## 2022-11-29 NOTE — Assessment & Plan Note (Signed)
Mild HA's, no recent need for imitrex.  Stable.

## 2022-11-30 ENCOUNTER — Telehealth: Payer: Self-pay | Admitting: Family

## 2022-11-30 LAB — RHEUMATOID FACTOR: Rheumatoid fact SerPl-aCnc: <14 IU/mL (ref <14)

## 2022-11-30 LAB — ANA: Anti Nuclear Antibody (ANA): NEGATIVE

## 2022-11-30 MED ORDER — POTASSIUM CHLORIDE CRYS ER 10 MEQ PO TBCR: 10.0000 meq | EXTENDED_RELEASE_TABLET | Freq: Every day | ORAL | 1 refills | Status: DC

## 2022-11-30 NOTE — Telephone Encounter (Signed)
A1c has improved, down to 5.9. her potassium is low though. Please advise her to restart Kdur 10 mEQ once daily.  Repeat bmet in 1-2 weeks.  I will send refills.

## 2022-11-30 NOTE — Telephone Encounter (Signed)
Called but no answer, lvm for patient to call a out results

## 2022-11-30 NOTE — Telephone Encounter (Signed)
Rheumatoid testing and sed rate (inflammatory marker) are normal.  ANA is pending- I will send her these results via mychart when they become available.

## 2022-12-01 NOTE — Telephone Encounter (Signed)
Patient made aware of results.  

## 2022-12-28 ENCOUNTER — Telehealth: Payer: Self-pay | Admitting: Family

## 2022-12-28 NOTE — Telephone Encounter (Signed)
See mychart.   Can you please cancel her appointment for tomorrow?

## 2022-12-29 ENCOUNTER — Ambulatory Visit: Payer: Managed Care, Other (non HMO) | Admitting: Family

## 2023-01-11 ENCOUNTER — Other Ambulatory Visit: Payer: Self-pay | Admitting: Family

## 2023-01-11 ENCOUNTER — Other Ambulatory Visit: Payer: Self-pay | Admitting: Nurse Practitioner

## 2023-01-11 DIAGNOSIS — Z79890 Hormone replacement therapy (postmenopausal): Secondary | ICD-10-CM

## 2023-01-11 MED ORDER — AZELASTINE HCL 137 MCG/SPRAY NA SOLN: 2.0000 | Freq: Two times a day (BID) | NASAL | 6 refills | Status: AC | PRN

## 2023-01-12 MED ORDER — ESTRADIOL 0.5 MG PO TABS: 0.5000 mg | ORAL_TABLET | Freq: Every day | ORAL | 0 refills | Status: DC

## 2023-01-12 NOTE — Telephone Encounter (Signed)
[facility] confirmed that they dispensed the 90day supply of the estradiol 0.24m tab (Sig: Take one tablet daily) to pt on 11/16/2022. By calculations, if pt was taking correctly, she should have enough of the medication to last her until 02/14/2023. However, pt states she only has two tabs left. Please advise.

## 2023-01-12 NOTE — Telephone Encounter (Signed)
Last AEX 01/09/2022--scheduled for 01/18/2023. Last mammo 01/23/2022--scheduled for 01/18/2023.  Last refill sent on 11/13/2022 for #90tabs for estradiol and progesterone. Pt should have enough on had to get her to appt on 01/18/2023.   Will call pt to confirm. Pt reported she only has two pills left and pharmacy has made her aware that she has no more refills on file. Pt advised I will contact pharmacy to confirm how her prescription has been dispensed and will get back with her. Pt voiced understanding

## 2023-01-16 ENCOUNTER — Other Ambulatory Visit (HOSPITAL_BASED_OUTPATIENT_CLINIC_OR_DEPARTMENT_OTHER): Payer: Self-pay | Admitting: Nurse Practitioner

## 2023-01-16 DIAGNOSIS — Z1231 Encounter for screening mammogram for malignant neoplasm of breast: Secondary | ICD-10-CM

## 2023-01-18 ENCOUNTER — Other Ambulatory Visit (HOSPITAL_BASED_OUTPATIENT_CLINIC_OR_DEPARTMENT_OTHER): Payer: Self-pay | Admitting: Nurse Practitioner

## 2023-01-18 ENCOUNTER — Ambulatory Visit (INDEPENDENT_AMBULATORY_CARE_PROVIDER_SITE_OTHER): Payer: Managed Care, Other (non HMO) | Admitting: Nurse Practitioner

## 2023-01-18 ENCOUNTER — Encounter: Payer: Self-pay | Admitting: Nurse Practitioner

## 2023-01-18 ENCOUNTER — Inpatient Hospital Stay (HOSPITAL_BASED_OUTPATIENT_CLINIC_OR_DEPARTMENT_OTHER)
Admission: RE | Admit: 2023-01-18 | Payer: Managed Care, Other (non HMO) | Source: Ambulatory Visit | Admitting: Radiology

## 2023-01-18 VITALS — BP 120/78 | Ht 63.0 in | Wt 130.0 lb

## 2023-01-18 DIAGNOSIS — Z1231 Encounter for screening mammogram for malignant neoplasm of breast: Secondary | ICD-10-CM

## 2023-01-18 DIAGNOSIS — Z01419 Encounter for gynecological examination (general) (routine) without abnormal findings: Secondary | ICD-10-CM | POA: Diagnosis not present

## 2023-01-18 DIAGNOSIS — Z79890 Hormone replacement therapy (postmenopausal): Secondary | ICD-10-CM

## 2023-01-18 MED ORDER — PROGESTERONE MICRONIZED 100 MG PO CAPS: 100.0000 mg | ORAL_CAPSULE | Freq: Every day | ORAL | 3 refills | Status: DC

## 2023-01-18 NOTE — Progress Notes (Signed)
Tiffany Cline 1961/07/26 [id number]   History:  62 y.o. G0 presents for annual exam. Postmenopausal - on HRT, no bleeding. HRT started mainly for generalized joint pain. She has had full improvement since starting. Has one bad night time hot flash each night. Normal pap history. HTN managed by PCP.  Gynecologic History Patient's last menstrual period was 07/22/2020 (lmp unknown).   Contraception/Family planning: post menopausal status Sexually active: No  Health Maintenance Last Pap: 12/10/2018. Results were: Normal neg HPV, 5-year repeat Last mammogram: 01/13/2022. Results were: Normal  Last colonoscopy: Never. Negative Cologuard 01/2022 Last Dexa: Not indicated  Past medical history, past surgical history, family history and social history were all reviewed and documented in the EPIC chart. 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   ROS:  A ROS was performed and pertinent positives and negatives are included.  Exam:  Vitals:   01/18/23 1014  Weight: 130 lb (59 kg)  Height: 5' 3"$  (1.6 m)    Body mass index is 23.03 kg/m.  General appearance:  Normal Thyroid:  Symmetrical, normal in size, without palpable masses or nodularity. Respiratory  Auscultation:  Clear without wheezing or rhonchi Cardiovascular  Auscultation:  Regular rate, without rubs, murmurs or gallops  Edema/varicosities:  Not grossly evident Abdominal  Soft,nontender, without masses, guarding or rebound.  Liver/spleen:  No organomegaly noted  Hernia:  None appreciated  Skin  Inspection:  Grossly normal Breasts: Examined lying and sitting.   Right: Without masses, retractions, nipple discharge or axillary adenopathy.   Left: Without masses, retractions, nipple discharge or axillary adenopathy. Genitourinary   Inguinal/mons:  Normal without inguinal adenopathy  External genitalia:  Normal appearing vulva with no masses, tenderness, or lesions  BUS/Urethra/Skene's glands:  Normal  Vagina:  Normal appearing with normal  color and discharge, no lesions  Cervix:  Normal appearing without discharge or lesions  Uterus:  Normal in size, shape and contour.  Midline and mobile, nontender  Adnexa/parametria:     Rt: Normal in size, without masses or tenderness.   Lt: Normal in size, without masses or tenderness.  Anus and perineum: Normal  Patient informed chaperone available to be present for breast and pelvic exam. Patient has requested no chaperone to be present. Patient has been advised what will be completed during breast and pelvic exam.   Assessment/Plan:  62 y.o. G0 for annual exam.   Well female exam with routine gynecological exam - Education provided on SBEs, importance of preventative screenings, current guidelines, high calcium diet, regular exercise, and multivitamin daily.  Labs with PCP.   Postmenopausal - on HRT, no bleeding.   Postmenopausal hormone therapy - Plan: progesterone (PROMETRIUM) 100 MG capsule nightly, estradiol (ESTRACE) 0.5 MG tablet daily. HRT started mainly for generalized joint pain. She has had full improvement since starting. She is aware of risk for blood clots, heart attack, stroke, and breast cancer with use, as well as the benefits of heart and bone health. Reports 1 bad nighttime hot flash. Does not want to increase dose. May try to take at night to see if this helps. Just had refill on estradiol.   Screening for cervical cancer - Normal Pap history.  Will repeat at 5-year interval per guidelines.  Screening for breast cancer - Normal mammogram history.  Continue annual screenings.  Normal breast exam today. Mammogram scheduled next week.  Screening for colon cancer - Negative Cologuard 01/2022. Will repeat at 3-year interval per guidelines.   Screening for osteoporosis - Average risk. Will plan for DXA at age  48.   Return in 1 year for annual.     Tamela Gammon DNP, 10:19 AM 01/18/2023

## 2023-01-23 ENCOUNTER — Ambulatory Visit (HOSPITAL_BASED_OUTPATIENT_CLINIC_OR_DEPARTMENT_OTHER)
Admission: RE | Admit: 2023-01-23 | Discharge: 2023-01-23 | Disposition: A | Discharge status: Home / Self Care | Payer: Managed Care, Other (non HMO) | Source: Ambulatory Visit | Attending: Nurse Practitioner | Admitting: Nurse Practitioner

## 2023-01-23 ENCOUNTER — Encounter (HOSPITAL_BASED_OUTPATIENT_CLINIC_OR_DEPARTMENT_OTHER): Payer: Self-pay

## 2023-01-23 DIAGNOSIS — Z1231 Encounter for screening mammogram for malignant neoplasm of breast: Secondary | ICD-10-CM | POA: Diagnosis present

## 2023-02-19 ENCOUNTER — Other Ambulatory Visit: Payer: Self-pay | Admitting: Family

## 2023-02-19 DIAGNOSIS — I10 Essential (primary) hypertension: Secondary | ICD-10-CM

## 2023-03-27 ENCOUNTER — Other Ambulatory Visit: Payer: Self-pay | Admitting: Family

## 2023-05-14 ENCOUNTER — Other Ambulatory Visit: Payer: Self-pay | Admitting: Pulmonary Disease

## 2023-05-17 ENCOUNTER — Other Ambulatory Visit: Payer: Self-pay | Admitting: Obstetrics & Gynecology

## 2023-05-17 DIAGNOSIS — Z79890 Hormone replacement therapy (postmenopausal): Secondary | ICD-10-CM

## 2023-05-17 NOTE — Telephone Encounter (Signed)
Med refill request: Estrace Last AEX: 01/18/23 Next AEX: not scheduled Last MMG (if hormonal med) 01/23/23 Refill authorized: Please Advise, #90, 0 RF

## 2023-05-18 ENCOUNTER — Other Ambulatory Visit: Payer: Self-pay | Admitting: Family

## 2023-05-18 DIAGNOSIS — I10 Essential (primary) hypertension: Secondary | ICD-10-CM

## 2023-05-30 ENCOUNTER — Other Ambulatory Visit: Payer: Self-pay | Admitting: Family

## 2023-11-22 ENCOUNTER — Other Ambulatory Visit: Payer: Self-pay | Admitting: Family

## 2023-12-04 ENCOUNTER — Other Ambulatory Visit: Payer: Self-pay | Admitting: Nurse Practitioner

## 2023-12-04 DIAGNOSIS — Z79890 Hormone replacement therapy (postmenopausal): Secondary | ICD-10-CM

## 2023-12-04 NOTE — Telephone Encounter (Signed)
 Med refill request: estradiol  0.5mg  tablet Last AEX: 01/18/23 Next AEX: none scheduled Last MMG (if hormonal med) 01/23/23 Refill authorized: estradiol  0.5mg  tablet #90 needs appointment for further refills, last refill 09/08/23 #90.  Please approve or deny as appropriate.

## 2024-02-12 ENCOUNTER — Other Ambulatory Visit (HOSPITAL_COMMUNITY)
Admission: RE | Admit: 2024-02-12 | Discharge: 2024-02-12 | Disposition: A | Discharge status: Home / Self Care | Payer: Self-pay | Source: Ambulatory Visit | Attending: Nurse Practitioner | Admitting: Nurse Practitioner

## 2024-02-12 ENCOUNTER — Ambulatory Visit (INDEPENDENT_AMBULATORY_CARE_PROVIDER_SITE_OTHER): Payer: Self-pay | Admitting: Nurse Practitioner

## 2024-02-12 ENCOUNTER — Encounter: Payer: Self-pay | Admitting: Nurse Practitioner

## 2024-02-12 VITALS — BP 132/84 | HR 70 | Ht 63.0 in | Wt 140.0 lb

## 2024-02-12 DIAGNOSIS — Z01419 Encounter for gynecological examination (general) (routine) without abnormal findings: Secondary | ICD-10-CM

## 2024-02-12 DIAGNOSIS — Z0184 Encounter for antibody response examination: Secondary | ICD-10-CM

## 2024-02-12 DIAGNOSIS — Z124 Encounter for screening for malignant neoplasm of cervix: Secondary | ICD-10-CM

## 2024-02-12 DIAGNOSIS — Z79890 Hormone replacement therapy (postmenopausal): Secondary | ICD-10-CM

## 2024-02-12 DIAGNOSIS — Z13220 Encounter for screening for lipoid disorders: Secondary | ICD-10-CM

## 2024-02-12 DIAGNOSIS — Z833 Family history of diabetes mellitus: Secondary | ICD-10-CM

## 2024-02-12 MED ORDER — PROGESTERONE MICRONIZED 100 MG PO CAPS: 100.0000 mg | ORAL_CAPSULE | Freq: Every day | ORAL | 3 refills | Status: AC

## 2024-02-12 MED ORDER — ESTRADIOL 0.5 MG PO TABS: 0.5000 mg | ORAL_TABLET | Freq: Every day | ORAL | 3 refills | Status: AC

## 2024-02-12 NOTE — Progress Notes (Signed)
 Tiffany Cline [birth year] [id number]   History:  63 y.o. G0 presents for annual exam. Postmenopausal - on HRT, no bleeding. HRT started mainly for generalized joint pain. She has had full improvement since starting. Does have one night sweat that wakes her up each night. Not interested in increasing dose. Normal pap history. HTN managed by PCP. Requesting MMR titer today.   Gynecologic History Patient's last menstrual period was 07/22/2020 (lmp unknown).   Contraception/Family planning: post menopausal status Sexually active: No  Health Maintenance Last Pap: 12/10/2018. Results were: Normal neg HPV Last mammogram: 01/23/2023. Results were: Normal  Last colonoscopy: Never. Negative Cologuard 01/2022 Last Dexa: Not indicated  Flowsheet Row Office Visit from 02/12/2024 in [facility]  PHQ-2 Total Score 0       Past medical history, past surgical history, family history and social history were all reviewed and documented in the EPIC chart. 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   ROS:  A ROS was performed and pertinent positives and negatives are included.  Exam:  Vitals:   02/12/24 1559  BP: 132/84  Pulse: 70  SpO2: 98%  Weight: 140 lb (63.5 kg)  Height: 5\' 3"  (1.6 m)     Body mass index is 24.8 kg/m.  General appearance:  Normal Thyroid:  Symmetrical, normal in size, without palpable masses or nodularity. Respiratory  Auscultation:  Clear without wheezing or rhonchi Cardiovascular  Auscultation:  Regular rate, without rubs, murmurs or gallops  Edema/varicosities:  Not grossly evident Abdominal  Soft,nontender, without masses, guarding or rebound.  Liver/spleen:  No organomegaly noted  Hernia:  None appreciated  Skin  Inspection:  Grossly normal Breasts: Examined lying and sitting.   Right: Without masses, retractions, nipple discharge or axillary adenopathy.   Left: Without masses, retractions, nipple discharge or axillary adenopathy. Pelvic:  External genitalia:  no lesions              Urethra:  normal appearing urethra with no masses, tenderness or lesions              Bartholins and Skenes: normal                 Vagina: normal appearing vagina with normal color and discharge, no lesions              Cervix: no lesions Bimanual Exam:  Uterus:  no masses or tenderness              Adnexa: no mass, fullness, tenderness              Rectovaginal: Deferred              Anus:  normal, no lesions  Patient informed chaperone available to be present for breast and pelvic exam. Patient has requested no chaperone to be present. Patient has been advised what will be completed during breast and pelvic exam.   Assessment/Plan:  63 y.o. G0 for annual exam.   Well female exam with routine gynecological exam - Plan: CBC with Differential/Platelet, Comprehensive metabolic panel. Education provided on SBEs, importance of preventative screenings, current guidelines, high calcium diet, regular exercise, and multivitamin daily.  Requesting labs. Will return fasting.   Postmenopausal hormone therapy - Plan: progesterone (PROMETRIUM) 100 MG capsule nightly, estradiol (ESTRACE) 0.5 MG tablet daily. HRT started mainly for generalized joint pain. She has had full improvement since starting. Aware of risk and benefits of use. Recommend patch but not interested.   Family history of diabetes mellitus in  mother - Plan: Hemoglobin A1c  Screening for lipid disorders - Plan: Lipid panel  Immunity status testing - Plan: Measles/Mumps/Rubella Immunity. Per request.   Cervical cancer screening - Plan: Cytology - PAP( [facility]). Normal Pap history.    Screening for breast cancer - Normal mammogram history.  Continue annual screenings.  Normal breast exam today. Planning to schedule in the May.   Screening for colon cancer - Negative Cologuard 01/2022. Will repeat at 3-year interval per guidelines.   Screening for osteoporosis - Average risk. Will plan for  DXA at age 62.   Return in about 1 year (around 02/11/2025) for Annual.    Olivia Mackie DNP, 4:06 PM 02/12/2024

## 2024-02-13 ENCOUNTER — Encounter: Payer: Self-pay | Admitting: Nurse Practitioner

## 2024-02-13 LAB — CYTOLOGY - PAP
Adequacy: ABSENT
Comment: NEGATIVE
Diagnosis: NEGATIVE
High risk HPV: NEGATIVE

## 2024-05-07 ENCOUNTER — Ambulatory Visit: Payer: Self-pay | Admitting: Student

## 2024-05-07 ENCOUNTER — Encounter: Payer: Self-pay | Admitting: Student

## 2024-05-07 ENCOUNTER — Ambulatory Visit (HOSPITAL_BASED_OUTPATIENT_CLINIC_OR_DEPARTMENT_OTHER)
Admission: RE | Admit: 2024-05-07 | Discharge: 2024-05-07 | Disposition: A | Discharge status: Home / Self Care | Source: Ambulatory Visit | Attending: Student | Admitting: Student

## 2024-05-07 ENCOUNTER — Other Ambulatory Visit (HOSPITAL_BASED_OUTPATIENT_CLINIC_OR_DEPARTMENT_OTHER): Payer: Self-pay | Admitting: Family

## 2024-05-07 VITALS — BP 118/86 | HR 71 | Temp 98.1°F | Resp 12 | Ht 63.0 in | Wt 142.6 lb

## 2024-05-07 DIAGNOSIS — M79671 Pain in right foot: Secondary | ICD-10-CM | POA: Insufficient documentation

## 2024-05-07 DIAGNOSIS — Z1231 Encounter for screening mammogram for malignant neoplasm of breast: Secondary | ICD-10-CM

## 2024-05-07 NOTE — Progress Notes (Signed)
 Subjective:     Patient ID: Tiffany Cline, female    DOB: 09-Feb-1961, 63 y.o.   MRN: [id number]  Chief Complaint  Patient presents with   Foot Pain    Right - something fell out of fridge onto foot     HPI  Tiffany Cline is a 63 year old woman who presents for left foot pain "Sunday on way to work she dropped item from fridge and landed on right foot. R foot was not hurting initially but her pain has increased over the past few days.  History of Present Illness          FOOT PAIN Duration: days Involved foot: right Mechanism of injury: trauma Location:  Onset: gradual  Severity: moderate  Quality:  sharp Frequency: a few times a day Radiation: yes Aggravating factors: weight bearing, walking, running, stairs, and movement  Alleviating factors: rest  Status: worse Treatments attempted: rest and ibuprofen  Relief with NSAIDs?:  mild Weakness with weight bearing or walking: no Morning stiffness: no Swelling: no Redness: no Bruising: no Paresthesias / decreased sensation: no  Fevers:no    Patient denies fever, chills, SOB, CP, palpitations, dyspnea, edema, HA, vision changes, N/V/D, abdominal pain, urinary symptoms, rash, weight changes, and recent illness or hospitalizations.   Health Maintenance Due  Topic Date Due   Pneumococcal Vaccine 19-64 Years old (3 of 3 - PCV20 or PCV21) 09/25/2023    Past Medical History:  Diagnosis Date   Asthma    induced by cold and allergies   Hyperglycemia    Hypertension    Leiomyoma     Past Surgical History:  Procedure Laterality Date   MYOMECTOMY  2001    Family History  Problem Relation Age of Onset   Diabetes Mother    Hypertension Mother    Hypertension Father    Cancer Father        prostate (15 yrs ago)   Thyroid disease Father        benign tumor   Congestive Heart Failure Father        died from fall/head trauma at age 86   Heart disease Maternal Grandmother        50's   Hypertension Paternal  Grandmother    Asthma Paternal Grandfather    Congestive Heart Failure Half-Brother    CAD Maternal Aunt        50's   CAD Maternal Aunt        50" 's    Social History   Socioeconomic History   Marital status: Single    Spouse name: n/a   Number of children: 0   Years of education: RPh   Highest education level: Not on file  Occupational History   Occupation: Pharmacist  Tobacco Use   Smoking status: Never   Smokeless tobacco: Never  Vaping Use   Vaping status: Never Used  Substance and Sexual Activity   Alcohol use: No   Drug use: No   Sexual activity: Not Currently    Birth control/protection: Post-menopausal  Other Topics Concern   Not on file  Social History Narrative   Lives alone with her pets. (2 dogs)   Family lives nearby.   Works part time as a Teacher, early years/pre at [employer]   Enjoys travelling   Social Drivers of Corporate investment banker Strain: Not on [employer] Insecurity: Not on file  Transportation Needs: Not on file  Physical Activity: Not on file  Stress: Not on file  Social Connections: Unknown (  04/07/2022)   Received from [facility]   Social Network    Social Network: Not on file  Intimate Partner Violence: Unknown (02/28/2022)   Received from [facility]   HITS    Physically Hurt: Not on file    Insult or Talk Down To: Not on file    Threaten Physical Harm: Not on file    Scream or Curse: Not on file    Outpatient Medications Prior to Visit  Medication Sig Dispense Refill   albuterol  (VENTOLIN  HFA) 108 (90 Base) MCG/ACT inhaler Inhale 2 puffs into the lungs every 6 (six) hours as needed for wheezing or shortness of breath. 8 g 2   amLODipine  (NORVASC ) 5 MG tablet Take 1 tablet by mouth once daily 90 tablet 0   Astaxanthin 4 MG CAPS      Azelastine  HCl 137 MCG/SPRAY SOLN Place 2 sprays into both nostrils 2 (two) times daily as needed. 30 mL 6   BREYNA  160-4.5 MCG/ACT inhaler Inhale 2 puffs into the lungs 2 (two)  times daily.     Clobetasol Propionate 0.05 % shampoo Apply topically as needed.     Collagen-Vitamin C-Biotin (COLLAGEN 1500/C PO)      estradiol  (ESTRACE ) 0.5 MG tablet Take 1 tablet (0.5 mg total) by mouth daily. 90 tablet 3   fluticasone  (FLONASE ) 50 MCG/ACT nasal spray USE TWO SPRAY(S) IN EACH NOSTRIL ONCE DAILY 16 g 11   Glucosamine-Chondroit-Vit C-Mn (GLUCOSAMINE CHONDROITIN COMPLX) CAPS      meloxicam  (MOBIC ) 15 MG tablet TAKE 1/2 TO 1 (ONE-HALF TO ONE) TABLET BY MOUTH ONCE DAILY AS NEEDED FOR PAIN 30 tablet 0   montelukast  (SINGULAIR ) 10 MG tablet TAKE 1 TABLET BY MOUTH AT BEDTIME 90 tablet 3   potassium chloride  (KLOR-CON ) 10 MEQ tablet Take 1 tablet by mouth once daily 180 tablet 0   progesterone  (PROMETRIUM ) 100 MG capsule Take 1 capsule (100 mg total) by mouth at bedtime. 90 capsule 3   promethazine  (PHENERGAN ) 25 MG tablet Take 1 tablet (25 mg total) by mouth every 8 (eight) hours as needed for nausea or vomiting. 20 tablet 0   Red Yeast Rice 600 MG CAPS      Resveratrol 100 MG CAPS      SUMAtriptan  (IMITREX ) 50 MG tablet TAKE 1 TABLET BY MOUTH. MAY REPEAT IN 2 HOURS IF HEADACHE PERSISTS OR RECURS 9 tablet 2   clobetasol (TEMOVATE) 0.05 % external solution Apply 1 Application topically 2 (two) times daily.     minoxidil (LONITEN) 2.5 MG tablet Take 1.25 mg by mouth daily.     Facility-Administered Medications Prior to Visit  Medication Dose Route Frequency Provider Last Rate Last Admin   ipratropium (ATROVENT ) nebulizer solution 0.5 mg  0.5 mg Nebulization Once Dunn, Ryan M, PA-C        Allergies  Allergen Reactions   Septra [Sulfamethoxazole-Trimethoprim] Shortness Of Breath and Rash   Latex Itching and Rash    Itching and redness with rash with use of latex gloves when she had blood drawn   Sulfa Antibiotics Rash    ROS    See HPI Objective:     Physical Exam  General: No acute distress. Awake and conversant.  Eyes: Normal conjunctiva, anicteric. Round symmetric  pupils.  ENT: Hearing grossly intact. No nasal discharge.  Neck: Neck is supple. No masses or thyromegaly.  Respiratory: CTAB. Respirations are non-labored. No wheezing.  Skin: Warm. No rashes or ulcers.  Psych: Alert and oriented. Cooperative, Appropriate  mood and affect, Normal judgment.  CV: No lower extremity edema.  MSK: Normal ambulation. No clubbing or cyanosis.  Foot exam: Normal range of motion in bilateral ankle and foot joints. Patient is able to bear weight with mild discomfort. Tenderness noted right foot over the 5th metatarsal,  at the midfoot with palpation. No swelling, bruising, deformity bilateral feet. Neuro:  CN II-XII grossly normal.     BP 118/86 (BP Location: Right Arm, Patient Position: Sitting, Cuff Size: Normal)   Pulse 71   Temp 98.1 F (36.7 C) (Oral)   Resp 12   Ht 5' 3 (1.6 m)   Wt 142 lb 9.6 oz (64.7 kg)   LMP 07/22/2020 (LMP Unknown)   SpO2 97%   BMI 25.26 kg/m  Wt Readings from Last 3 Encounters:  05/07/24 142 lb 9.6 oz (64.7 kg)  02/12/24 140 lb (63.5 kg)  01/18/23 130 lb (59 kg)       Assessment & Plan:   Problem List Items Addressed This Visit     Right foot pain - Primary    Obtain right foot x-ray. Consider Ortho referral based on results. Ice and Elevation, Meloxicam  prn for pain control. RTC for persistent or worsening symptoms.      Relevant Orders   DG Foot Complete Right       I have discontinued Tiffany Cline's minoxidil. I am also having her maintain her fluticasone , promethazine , Glucosamine Chondroitin Complx, Collagen-Vitamin C-Biotin (COLLAGEN 1500/C PO), Astaxanthin, Red Yeast Rice, Resveratrol, albuterol , meloxicam , montelukast , Azelastine  HCl, SUMAtriptan , amLODipine , potassium chloride , estradiol , progesterone , Breyna , and Clobetasol Propionate. We will continue to administer ipratropium.  No orders of the defined types were placed in this encounter.

## 2024-05-07 NOTE — Assessment & Plan Note (Signed)
 Obtain right foot x-ray. Consider Ortho referral based on results. Ice and Elevation, Meloxicam  prn for pain control. RTC for persistent or worsening symptoms.

## 2024-05-07 NOTE — Patient Instructions (Addendum)
 Tiffany Cline

## 2024-05-08 ENCOUNTER — Encounter (HOSPITAL_BASED_OUTPATIENT_CLINIC_OR_DEPARTMENT_OTHER): Payer: Self-pay

## 2024-05-08 ENCOUNTER — Ambulatory Visit (HOSPITAL_BASED_OUTPATIENT_CLINIC_OR_DEPARTMENT_OTHER)
Admission: RE | Admit: 2024-05-08 | Discharge: 2024-05-08 | Disposition: A | Discharge status: Home / Self Care | Source: Ambulatory Visit | Attending: Family | Admitting: Family

## 2024-05-08 DIAGNOSIS — Z1231 Encounter for screening mammogram for malignant neoplasm of breast: Secondary | ICD-10-CM | POA: Diagnosis present

## 2024-05-12 ENCOUNTER — Other Ambulatory Visit

## 2024-05-12 DIAGNOSIS — Z833 Family history of diabetes mellitus: Secondary | ICD-10-CM

## 2024-05-12 DIAGNOSIS — Z13220 Encounter for screening for lipoid disorders: Secondary | ICD-10-CM

## 2024-05-12 DIAGNOSIS — Z01419 Encounter for gynecological examination (general) (routine) without abnormal findings: Secondary | ICD-10-CM

## 2024-05-12 DIAGNOSIS — Z0184 Encounter for antibody response examination: Secondary | ICD-10-CM

## 2024-05-13 LAB — COMPREHENSIVE METABOLIC PANEL WITH GFR
AG Ratio: 1.6 (calc) (ref 1.0–2.5)
ALT: 16 U/L (ref 6–29)
AST: 16 U/L (ref 10–35)
Albumin: 4 g/dL (ref 3.6–5.1)
Alkaline phosphatase (APISO): 63 U/L (ref 37–153)
BUN: 11 mg/dL (ref 7–25)
CO2: 28 mmol/L (ref 20–32)
Calcium: 8.7 mg/dL (ref 8.6–10.4)
Chloride: 105 mmol/L (ref 98–110)
Creat: 0.65 mg/dL (ref 0.50–1.05)
Globulin: 2.5 g/dL (ref 1.9–3.7)
Glucose, Bld: 101 mg/dL — ABNORMAL HIGH (ref 65–99)
Potassium: 3.8 mmol/L (ref 3.5–5.3)
Sodium: 142 mmol/L (ref 135–146)
Total Bilirubin: 0.5 mg/dL (ref 0.2–1.2)
Total Protein: 6.5 g/dL (ref 6.1–8.1)
eGFR: 100 mL/min/{1.73_m2} (ref >=60)

## 2024-05-13 LAB — CBC WITH DIFFERENTIAL/PLATELET
Absolute Lymphocytes: 1733 {cells}/uL (ref 850–3900)
Absolute Monocytes: 578 {cells}/uL (ref 200–950)
Basophils Absolute: 38 {cells}/uL (ref 0–200)
Basophils Relative: 0.7 %
Eosinophils Absolute: 151 {cells}/uL (ref 15–500)
Eosinophils Relative: 2.8 %
HCT: 41.8 % (ref 35.0–45.0)
Hemoglobin: 13.3 g/dL (ref 11.7–15.5)
MCH: 29.2 pg (ref 27.0–33.0)
MCHC: 31.8 g/dL — ABNORMAL LOW (ref 32.0–36.0)
MCV: 91.7 fL (ref 80.0–100.0)
MPV: 11.7 fL (ref 7.5–12.5)
Monocytes Relative: 10.7 %
Neutro Abs: 2900 {cells}/uL (ref 1500–7800)
Neutrophils Relative %: 53.7 %
Platelets: 229 10*3/uL (ref 140–400)
RBC: 4.56 10*6/uL (ref 3.80–5.10)
RDW: 13 % (ref 11.0–15.0)
Total Lymphocyte: 32.1 %
WBC: 5.4 10*3/uL (ref 3.8–10.8)

## 2024-05-13 LAB — LIPID PANEL
Cholesterol: 208 mg/dL — ABNORMAL HIGH (ref <200)
HDL: 92 mg/dL (ref >=50)
LDL Cholesterol (Calc): 105 mg/dL — ABNORMAL HIGH
Non-HDL Cholesterol (Calc): 116 mg/dL (ref <130)
Total CHOL/HDL Ratio: 2.3 (calc) (ref <5.0)
Triglycerides: 37 mg/dL (ref <150)

## 2024-05-13 LAB — MEASLES/MUMPS/RUBELLA IMMUNITY
Mumps IgG: >300 [AU]/ml
Rubella: 22.3 {index}
Rubeola IgG: 37.1 [AU]/ml

## 2024-05-13 LAB — HEMOGLOBIN A1C
Hgb A1c MFr Bld: 6.1 % — ABNORMAL HIGH (ref <5.7)
Mean Plasma Glucose: 128 mg/dL
eAG (mmol/L): 7.1 mmol/L

## 2024-05-14 ENCOUNTER — Ambulatory Visit: Payer: Self-pay | Admitting: Nurse Practitioner
